# Patient Record
Sex: Female | Born: 1985 | Race: Black or African American | Hispanic: No | Marital: Single | Smoking: Never smoker
Health system: Southern US, Community
[De-identification: ages and names within clinical notes are randomized; demographics above are authoritative.]

## PROBLEM LIST (undated history)

## (undated) DIAGNOSIS — E282 Polycystic ovarian syndrome: Secondary | ICD-10-CM

## (undated) DIAGNOSIS — K589 Irritable bowel syndrome without diarrhea: Secondary | ICD-10-CM

## (undated) HISTORY — PX: APPENDECTOMY: SHX54

## (undated) HISTORY — DX: Polycystic ovarian syndrome: E28.2

## (undated) HISTORY — DX: Irritable bowel syndrome, unspecified: K58.9

## (undated) HISTORY — PX: FLEXIBLE SIGMOIDOSCOPY: SHX1649

---
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
## 2007-09-09 ENCOUNTER — Emergency Department (HOSPITAL_COMMUNITY): Admission: EM | Admit: 2007-09-09 | Discharge: 2007-09-09 | Payer: Self-pay | Admitting: Emergency Medicine

## 2007-09-14 ENCOUNTER — Emergency Department (HOSPITAL_COMMUNITY): Admission: EM | Admit: 2007-09-14 | Discharge: 2007-09-14 | Payer: Self-pay | Admitting: Emergency Medicine

## 2008-11-12 ENCOUNTER — Emergency Department (HOSPITAL_COMMUNITY): Admission: EM | Admit: 2008-11-12 | Discharge: 2008-11-12 | Payer: Self-pay | Admitting: Emergency Medicine

## 2010-05-24 LAB — CBC
HCT: 29.6 % — ABNORMAL LOW (ref 36.0–46.0)
Hemoglobin: 9.5 g/dL — ABNORMAL LOW (ref 12.0–15.0)
MCHC: 32.2 g/dL (ref 30.0–36.0)
MCV: 78.2 fL (ref 78.0–100.0)
Platelets: 258 10*3/uL (ref 150–400)
RBC: 3.78 MIL/uL — ABNORMAL LOW (ref 3.87–5.11)
RDW: 14.4 % (ref 11.5–15.5)
WBC: 8.4 10*3/uL (ref 4.0–10.5)

## 2010-05-24 LAB — DIFFERENTIAL
Basophils Absolute: 0 10*3/uL (ref 0.0–0.1)
Basophils Relative: 0 % (ref 0–1)
Eosinophils Absolute: 0.1 10*3/uL (ref 0.0–0.7)
Eosinophils Relative: 1 % (ref 0–5)
Lymphocytes Relative: 28 % (ref 12–46)
Lymphs Abs: 2.4 10*3/uL (ref 0.7–4.0)
Monocytes Absolute: 0.8 10*3/uL (ref 0.1–1.0)
Monocytes Relative: 9 % (ref 3–12)
Neutro Abs: 5.1 10*3/uL (ref 1.7–7.7)
Neutrophils Relative %: 62 % (ref 43–77)

## 2010-05-24 LAB — COMPREHENSIVE METABOLIC PANEL
ALT: 12 U/L (ref 0–35)
AST: 19 U/L (ref 0–37)
Albumin: 3.9 g/dL (ref 3.5–5.2)
Alkaline Phosphatase: 81 U/L (ref 39–117)
BUN: 12 mg/dL (ref 6–23)
CO2: 27 mEq/L (ref 19–32)
Calcium: 9.5 mg/dL (ref 8.4–10.5)
Chloride: 103 mEq/L (ref 96–112)
Creatinine, Ser: 0.88 mg/dL (ref 0.4–1.2)
GFR calc Af Amer: >60 mL/min (ref >60)
GFR calc non Af Amer: >60 mL/min (ref >60)
Glucose, Bld: 92 mg/dL (ref 70–99)
Potassium: 4.6 mEq/L (ref 3.5–5.1)
Sodium: 136 mEq/L (ref 135–145)
Total Bilirubin: 0.4 mg/dL (ref 0.3–1.2)
Total Protein: 7.4 g/dL (ref 6.0–8.3)

## 2010-05-24 LAB — URINE MICROSCOPIC-ADD ON

## 2010-05-24 LAB — POCT CARDIAC MARKERS
Myoglobin, poc: 38.9 ng/mL (ref 12–200)
Troponin i, poc: <0.05 ng/mL (ref 0.00–0.09)

## 2010-05-24 LAB — WET PREP, GENITAL
Trich, Wet Prep: NONE SEEN
Yeast Wet Prep HPF POC: NONE SEEN

## 2010-05-24 LAB — URINALYSIS, ROUTINE W REFLEX MICROSCOPIC
Bilirubin Urine: NEGATIVE
Nitrite: NEGATIVE
Protein, ur: NEGATIVE mg/dL
Specific Gravity, Urine: 1.025 (ref 1.005–1.030)
Urobilinogen, UA: 0.2 mg/dL (ref 0.0–1.0)

## 2010-05-24 LAB — PREGNANCY, URINE: Preg Test, Ur: NEGATIVE

## 2010-05-24 LAB — GC/CHLAMYDIA PROBE AMP, GENITAL: GC Probe Amp, Genital: NEGATIVE

## 2010-10-16 IMAGING — CR DG CHEST 2V
2 series · 2 of 2 positions shown · non-contrast
Comparison: None

CLINICAL DATA: Chest pain and back pain

CHEST - 2 VIEW

[view not recorded (1 of 2)]
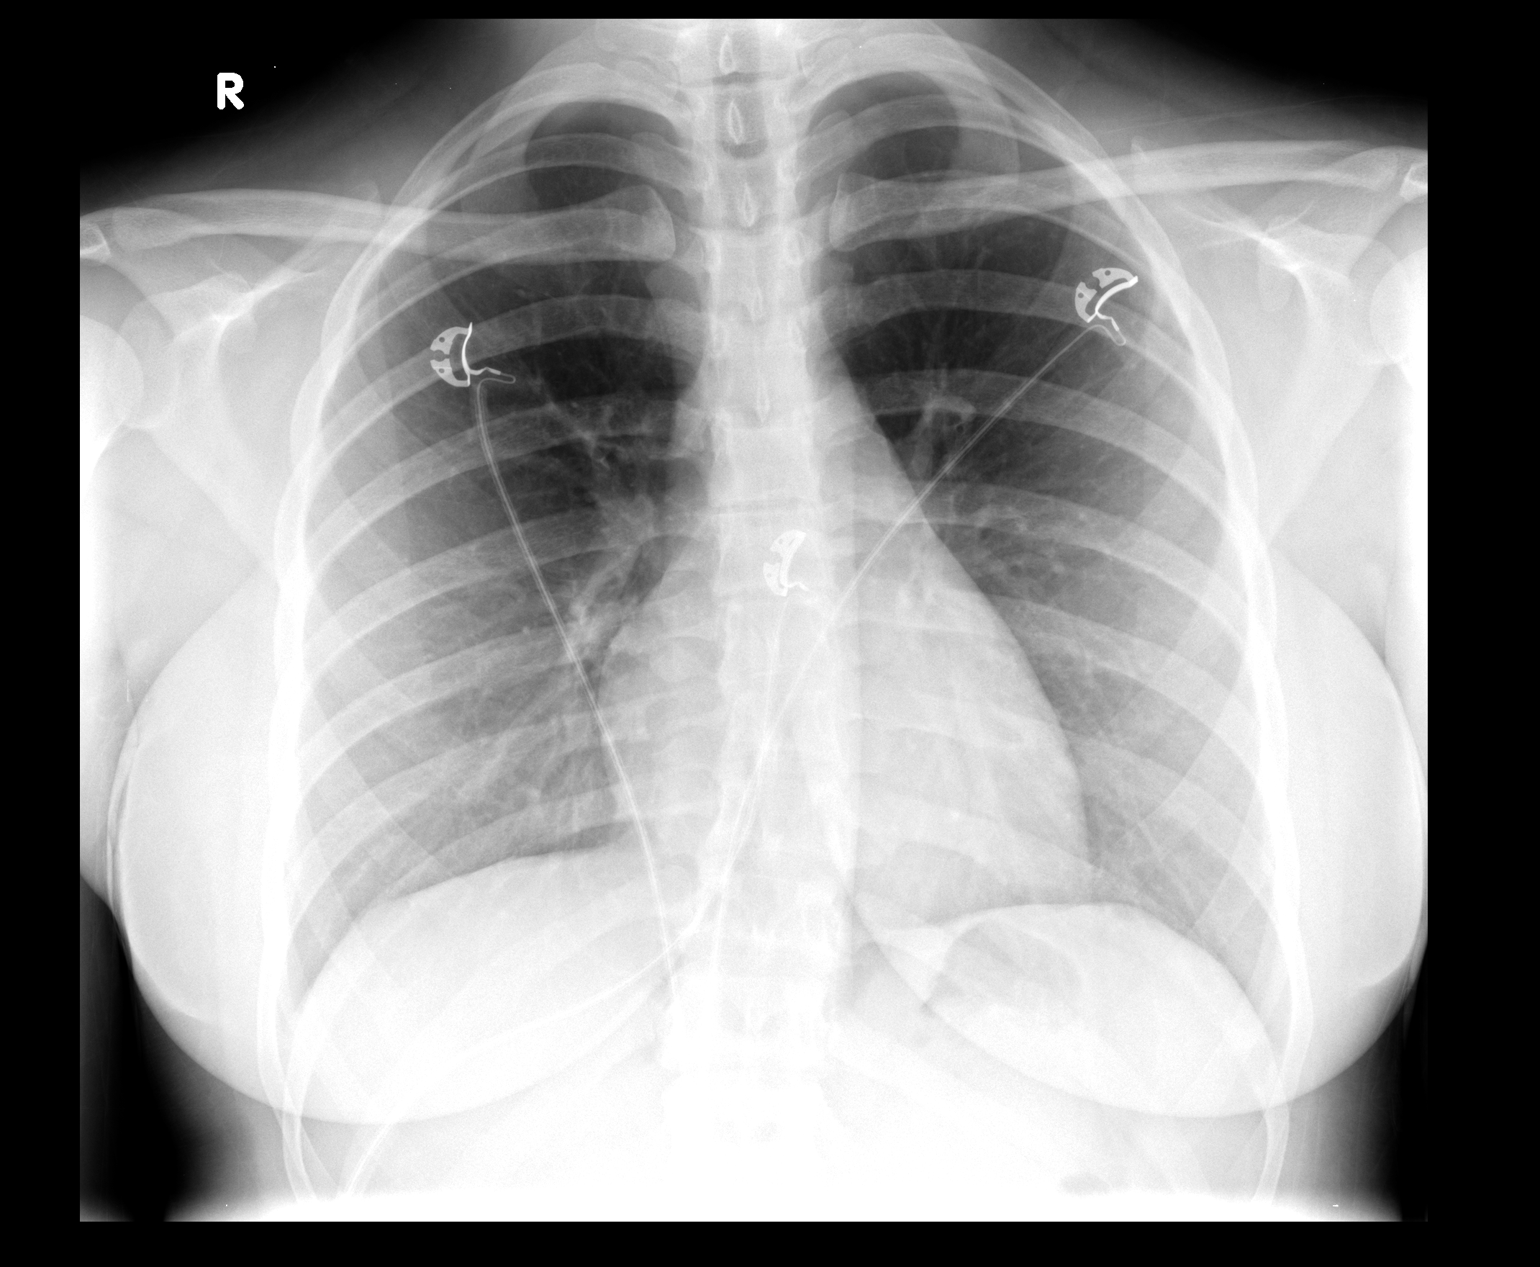

[view not recorded (2 of 2)]
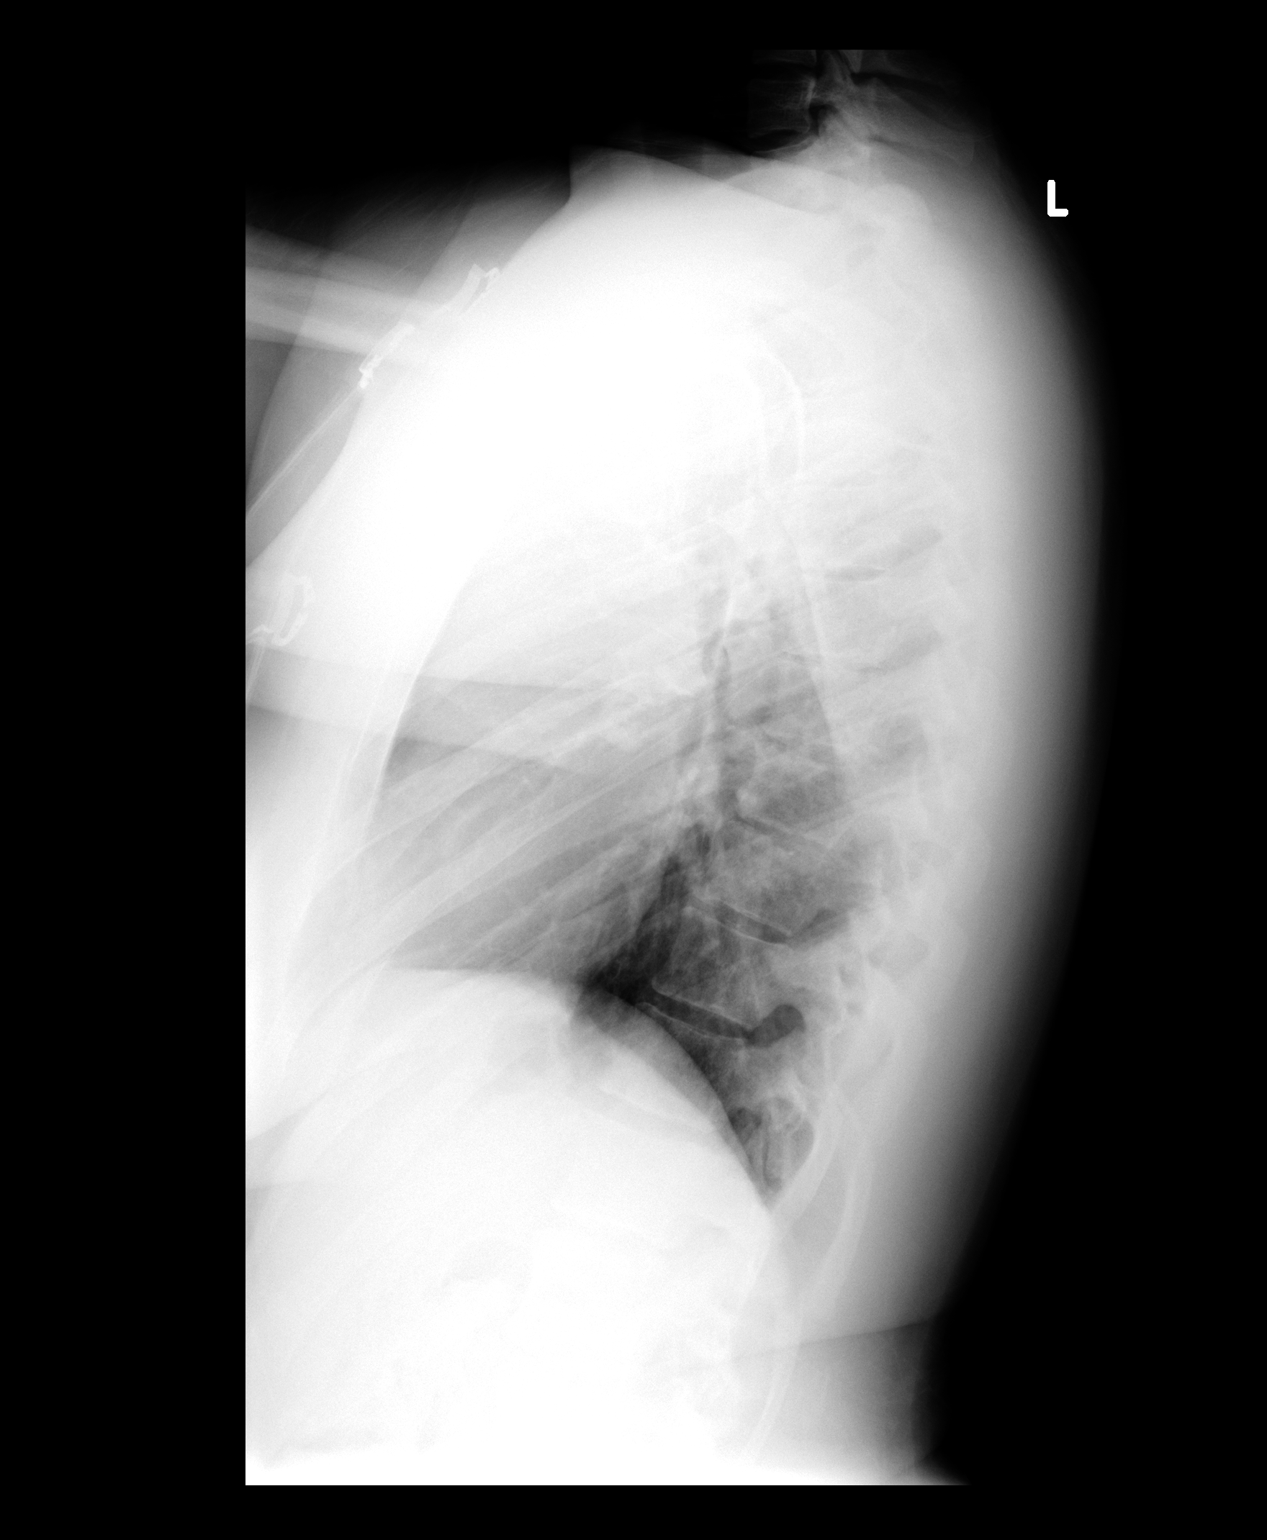

[2 of 2 positions shown; findings below may reference images not displayed]

FINDINGS: Normal mediastinum and heart silhouette.  Costophrenic
angles are clear.  No evidence effusion, infiltrate, or
pneumothorax.
IMPRESSION: No acute cardiopulmonary process.

## 2014-12-25 ENCOUNTER — Encounter (HOSPITAL_COMMUNITY): Payer: Self-pay | Admitting: *Deleted

## 2014-12-25 ENCOUNTER — Emergency Department (HOSPITAL_COMMUNITY)
Admission: EM | Admit: 2014-12-25 | Discharge: 2014-12-25 | Disposition: A | Discharge status: Home / Self Care | Payer: 59 | Attending: Emergency Medicine | Admitting: Emergency Medicine

## 2014-12-25 DIAGNOSIS — Z880 Allergy status to penicillin: Secondary | ICD-10-CM | POA: Insufficient documentation

## 2014-12-25 DIAGNOSIS — L259 Unspecified contact dermatitis, unspecified cause: Secondary | ICD-10-CM | POA: Diagnosis not present

## 2014-12-25 DIAGNOSIS — R21 Rash and other nonspecific skin eruption: Secondary | ICD-10-CM | POA: Diagnosis present

## 2014-12-25 MED ORDER — PREDNISONE 20 MG PO TABS: ORAL_TABLET | ORAL | Status: DC

## 2014-12-25 MED ORDER — DEXAMETHASONE SODIUM PHOSPHATE 10 MG/ML IJ SOLN
10.0000 mg | Freq: Once | INTRAMUSCULAR | Status: AC
  Administered 2014-12-25: 10 mg via INTRAMUSCULAR
  Filled 2014-12-25: qty 1

## 2014-12-25 MED ORDER — HYDROXYZINE HCL 25 MG PO TABS: 25.0000 mg | ORAL_TABLET | Freq: Four times a day (QID) | ORAL | Status: DC

## 2014-12-25 NOTE — ED Notes (Signed)
Pt c/o rash all over body x 2 weeks, pt states it has gotten progressively worse

## 2014-12-25 NOTE — ED Provider Notes (Signed)
CSN: [id number]     Arrival date & time 12/25/14  0426 History   First MD Initiated Contact with Patient 12/25/14 0448     Chief Complaint  Patient presents with  . Rash     (Consider location/radiation/quality/duration/timing/severity/associated sxs/prior Treatment) HPI Comments: Patient presents to the ER for evaluation of rash. Patient reports that she broke out in a rash 2 weeks ago. She was seen a week ago at urgent care and was given a steroid shot and prescribed hydroxyzine, but rash has not improved. She reports a diffuse itchy raised rash. She has not identified any allergens or new topical skin products. 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 There is no tongue swelling or throat swelling. She is not short of breath.  Patient is a 29 y.o. female presenting with rash.  Rash   History reviewed. No pertinent past medical history. Past Surgical History  Procedure Laterality Date  . Appendectomy    . Flexible sigmoidoscopy     History reviewed. No pertinent family history. Social History  Substance Use Topics  . Smoking status: Never Smoker   . Smokeless tobacco: None  . Alcohol Use: Yes     Comment: occasionally   OB History    No data available     Review of Systems  Skin: Positive for rash.  All other systems reviewed and are negative.     Allergies  Amoxicillin; Benadryl; Nsaids; and Penicillins  Home Medications   Prior to Admission medications   Not on File   LMP 12/20/2014 Physical Exam  Constitutional: She is oriented to person, place, and time. She appears well-developed and well-nourished. No distress.  HENT:  Head: Normocephalic and atraumatic.  Right Ear: Hearing normal.  Left Ear: Hearing normal.  Nose: Nose normal.  Mouth/Throat: Oropharynx is clear and moist and mucous membranes are normal.  Eyes: Conjunctivae and EOM are normal.  Pupils are equal, round, and reactive to light.  Neck: Normal range of motion. Neck supple.  Cardiovascular: Regular rhythm, S1 normal and S2 normal.  Exam reveals no gallop and no friction rub.   No murmur heard. Pulmonary/Chest: Effort normal and breath sounds normal. No respiratory distress. She exhibits no tenderness.  Abdominal: Soft. Normal appearance and bowel sounds are normal. There is no hepatosplenomegaly. There is no tenderness. There is no rebound, no guarding, no tenderness at McBurney's point and negative Murphy's sign. No hernia.  Musculoskeletal: Normal range of motion.  Neurological: She is alert and oriented to person, place, and time. She has normal strength. No cranial nerve deficit or sensory deficit. Coordination normal. GCS eye subscore is 4. GCS verbal subscore is 5. GCS motor subscore is 6.  Skin: Skin is warm, dry and intact. Rash (diffuse papular and macular raised rash without induration, erythema or warmth) noted. No cyanosis.  Psychiatric: She has a normal mood and affect. Her speech is normal and behavior is normal. Thought content normal.  Nursing note and vitals reviewed.   ED Course  Procedures (including critical care time) Labs Review Labs Reviewed - No data to display  Imaging Review No results found. I have personally reviewed and evaluated these images and lab results as part of my medical decision-making.   EKG Interpretation None      MDM   Final diagnoses:  None   contact dermatitis  Patient has a rash that  is diffusely should be over her body's, although worse on her upper extremities. The rash is not only in exposed areas. She does not have any systemic signs. Symptoms seem most consistent with an allergic reaction. Give dose of Decadron here and to a formal prednisone taper, continue hydroxyzine as needed for symptomatic relief. Follow-up with primary doctor to be referred to dermatologist if not improving.    Gilda Crease,  MD 12/25/14 [phone]

## 2014-12-25 NOTE — Discharge Instructions (Signed)
Contact Dermatitis Dermatitis is redness, soreness, and swelling (inflammation) of the skin. Contact dermatitis is a reaction to certain substances that touch the skin. There are two types of contact dermatitis:   Irritant contact dermatitis. This type is caused by something that irritates your skin, such as dry hands from washing them too much. This type does not require previous exposure to the substance for a reaction to occur. This type is more common.  Allergic contact dermatitis. This type is caused by a substance that you are allergic to, such as a nickel allergy or poison ivy. This type only occurs if you have been exposed to the substance (allergen) before. Upon a repeat exposure, your body reacts to the substance. This type is less common. CAUSES  Many different substances can cause contact dermatitis. Irritant contact dermatitis is most commonly caused by exposure to:   Makeup.   Soaps.   Detergents.   Bleaches.   Acids.   Metal salts, such as nickel.  Allergic contact dermatitis is most commonly caused by exposure to:   Poisonous plants.   Chemicals.   Jewelry.   Latex.   Medicines.   Preservatives in products, such as clothing.  RISK FACTORS This condition is more likely to develop in:   People who have jobs that expose them to irritants or allergens.  People who have certain medical conditions, such as asthma or eczema.  SYMPTOMS  Symptoms of this condition may occur anywhere on your body where the irritant has touched you or is touched by you. Symptoms include:  Dryness or flaking.   Redness.   Cracks.   Itching.   Pain or a burning feeling.   Blisters.  Drainage of small amounts of blood or clear fluid from skin cracks. With allergic contact dermatitis, there may also be swelling in areas such as the eyelids, mouth, or genitals.  DIAGNOSIS  This condition is diagnosed with a medical history and physical exam. A patch skin test  may be performed to help determine the cause. If the condition is related to your job, you may need to see an occupational medicine specialist. TREATMENT Treatment for this condition includes figuring out what caused the reaction and protecting your skin from further contact. Treatment may also include:   Steroid creams or ointments. Oral steroid medicines may be needed in more severe cases.  Antibiotics or antibacterial ointments, if a skin infection is present.  Antihistamine lotion or an antihistamine taken by mouth to ease itching.  A bandage (dressing). HOME CARE INSTRUCTIONS Skin Care  Moisturize your skin as needed.   Apply cool compresses to the affected areas.  Try taking a bath with:  Epsom salts. Follow the instructions on the packaging. You can get these at your local pharmacy or grocery store.  Baking soda. Pour a small amount into the bath as directed by your health care provider.  Colloidal oatmeal. Follow the instructions on the packaging. You can get this at your local pharmacy or grocery store.  Try applying baking soda paste to your skin. Stir water into baking soda until it reaches a paste-like consistency.  Do not scratch your skin.  Bathe less frequently, such as every other day.  Bathe in lukewarm water. Avoid using hot water. Medicines  Take or apply over-the-counter and prescription medicines only as told by your health care provider.   If you were prescribed an antibiotic medicine, take or apply your antibiotic as told by your health care provider. Do not stop using the   antibiotic even if your condition starts to improve. General Instructions  Keep all follow-up visits as told by your health care provider. This is important.  Avoid the substance that caused your reaction. If you do not know what caused it, keep a journal to try to track what caused it. Write down:  What you eat.  What cosmetic products you use.  What you drink.  What  you wear in the affected area. This includes jewelry.  If you were given a dressing, take care of it as told by your health care provider. This includes when to change and remove it. SEEK MEDICAL CARE IF:   Your condition does not improve with treatment.  Your condition gets worse.  You have signs of infection such as swelling, tenderness, redness, soreness, or warmth in the affected area.  You have a fever.  You have new symptoms. SEEK IMMEDIATE MEDICAL CARE IF:   You have a severe headache, neck pain, or neck stiffness.  You vomit.  You feel very sleepy.  You notice red streaks coming from the affected area.  Your bone or joint underneath the affected area becomes painful after the skin has healed.  The affected area turns darker.  You have difficulty breathing.   This information is not intended to replace advice given to you by your health care provider. Make sure you discuss any questions you have with your health care provider.   Document Released: 02/01/2000 Document Revised: 10/25/2014 Document Reviewed: 06/21/2014 Elsevier Interactive Patient Education 2016 Elsevier Inc.  

## 2015-09-03 ENCOUNTER — Emergency Department (HOSPITAL_COMMUNITY)
Admission: EM | Admit: 2015-09-03 | Discharge: 2015-09-03 | Disposition: A | Discharge status: Home / Self Care | Payer: BLUE CROSS/BLUE SHIELD | Attending: Emergency Medicine | Admitting: Emergency Medicine

## 2015-09-03 ENCOUNTER — Encounter (HOSPITAL_COMMUNITY): Payer: Self-pay | Admitting: Emergency Medicine

## 2015-09-03 DIAGNOSIS — H9209 Otalgia, unspecified ear: Secondary | ICD-10-CM | POA: Diagnosis not present

## 2015-09-03 DIAGNOSIS — J029 Acute pharyngitis, unspecified: Secondary | ICD-10-CM

## 2015-09-03 DIAGNOSIS — Z79899 Other long term (current) drug therapy: Secondary | ICD-10-CM | POA: Insufficient documentation

## 2015-09-03 LAB — RAPID STREP SCREEN (MED CTR MEBANE ONLY): STREPTOCOCCUS, GROUP A SCREEN (DIRECT): NEGATIVE

## 2015-09-03 NOTE — ED Notes (Signed)
Patient complaining of sore throat and laryngitis starting this morning.

## 2015-09-03 NOTE — ED Provider Notes (Signed)
CSN: [id number]     Arrival date & time 09/03/15  1346 History  By signing my name below, I, Denise Marsh, attest that this documentation has been prepared under the direction and in the presence of Kerrie Buffalo, NP. Electronically Signed: Alyssa Marsh, ED Scribe. 09/03/2015. 2:54 PM.    Chief Complaint  Patient presents with  . Sore Throat    The history is provided by the patient. No language interpreter was used.    HPI Comments: Denise Marsh is a 30 y.o. female who presents to the Emergency Department complaining of gradual onset sore throat onset this morning. Pt states she woke up this morning with a sore throat. Pt states she has slight ear pain when swallowing. She reports associated loss of her voice. Pt states that no one at home is sick. She states she was able to eat this morning, but that it hurts to eat and drink. Pt denies vomiting, fever.   History reviewed. No pertinent past medical history. Past Surgical History  Procedure Laterality Date  . Appendectomy    . Flexible sigmoidoscopy     History reviewed. No pertinent family history. Social History  Substance Use Topics  . Smoking status: Never Smoker   . Smokeless tobacco: None  . Alcohol Use: Yes     Comment: occasionally   OB History    No data available     Review of Systems  Constitutional: Negative for fever and chills.  HENT: Positive for ear pain and sore throat.   Eyes: Negative for redness.  Respiratory: Negative for cough and shortness of breath.   Gastrointestinal: Negative for vomiting.  Genitourinary: Negative for dysuria.  Skin: Negative for rash.  Neurological: Negative for syncope and headaches.  Psychiatric/Behavioral: Negative for confusion. The patient is not nervous/anxious.     Allergies  Amoxicillin; Benadryl; Nsaids; and Penicillins  Home Medications   Prior to Admission medications   Medication Sig Start Date End Date Taking? Authorizing Provider  Norethindrone-Eth  Estradiol (PIRMELLA 1/35 PO) Take 1 tablet by mouth daily.   Yes Historical Provider, MD  hydrOXYzine (ATARAX/VISTARIL) 25 MG tablet Take 1 tablet (25 mg total) by mouth every 6 (six) hours. Patient not taking: Reported on 09/03/2015 12/25/14   Denise Crease, MD  predniSONE (DELTASONE) 20 MG tablet 3 tabs po daily x 3 days, then 2 tabs x 3 days, then 1.5 tabs x 3 days, then 1 tab x 3 days, then 0.5 tabs x 3 days Patient not taking: Reported on 09/03/2015 12/25/14   Denise Crease, MD   BP 141/81 mmHg  Pulse 91  Temp(Src) 98.5 F (36.9 C) (Oral)  Resp 16  Ht  (1.651 m)  Wt 102.059 kg  BMI 37.44 kg/m2  SpO2 96%  LMP 09/02/2015 Physical Exam  Constitutional: She is oriented to person, place, and time. She appears well-developed and well-nourished. No distress.  HENT:  Head: Normocephalic.  Right Ear: External ear normal.  Left Ear: External ear normal.  Tonsils are slightly enlarged, mild erythema, no edema Uvula midline  Eyes: Conjunctivae are normal. Pupils are equal, round, and reactive to light. No scleral icterus.  Neck: Normal range of motion. Neck supple. No thyromegaly present.  No CVA tenderness  Cardiovascular: Normal rate and regular rhythm.   Pulmonary/Chest: Effort normal and breath sounds normal. No respiratory distress.  Abdominal: Soft. She exhibits no distension.  Musculoskeletal: Normal range of motion.  Lymphadenopathy:    She has no cervical adenopathy.  Neurological: She  is alert and oriented to person, place, and time.  Skin: Skin is warm and dry. No rash noted.  Psychiatric: She has a normal mood and affect. Her behavior is normal.  Nursing note and vitals reviewed.   ED Course  Procedures (including critical care time)  DIAGNOSTIC STUDIES: Oxygen Saturation is 96% on RA, adequate by my interpretation.    COORDINATION OF CARE: 2:46 PM Discussed treatment plan with pt at bedside which includes Rapid Strep Test and pt agreed to  plan.  Labs Review Labs Reviewed  RAPID STREP SCREEN (NOT AT [facility])  CULTURE, GROUP A STREP [facility])   I have personally reviewed and evaluated these lab results as part of my medical decision-making.   MDM  30 y.o. female with sore throat and laryngitis stable for d/c without difficulty swallowing, she has no fever and does not appear toxic. Will treat for viral sore throat with Chloraseptic Spray, tylenol and ibuprofen and salt water gargles. She will f/u with her PCP or return here for any problems. Discussed with the patient and all questioned fully answered.   Final diagnoses:  Viral pharyngitis   I personally performed the services described in this documentation, which was scribed in my presence. The recorded information has been reviewed and is accurate.   [place] M [place], [place] 09/03/15 1502  Blane OharaJoshua Zavitz, MD 09/04/15 [phone]

## 2015-09-03 NOTE — Discharge Instructions (Signed)
Your strep screen today is negative. Take tyleol and ibuprofen as needed for fever and/or pain. Use salt water gargles, Chlorasepetic spray and follow up with your doctor. Return here as needed for any problems.   Sore Throat A sore throat is pain, burning, irritation, or scratchiness of the throat. There is often pain or tenderness when swallowing or talking. A sore throat may be accompanied by other symptoms, such as coughing, sneezing, fever, and swollen neck glands. A sore throat is often the first sign of another sickness, such as a cold, flu, strep throat, or mononucleosis (commonly known as mono). Most sore throats go away without medical treatment. CAUSES  The most common causes of a sore throat include:  A viral infection, such as a cold, flu, or mono.  A bacterial infection, such as strep throat, tonsillitis, or whooping cough.  Seasonal allergies.  Dryness in the air.  Irritants, such as smoke or pollution.  Gastroesophageal reflux disease (GERD). HOME CARE INSTRUCTIONS   Only take over-the-counter medicines as directed by your caregiver.  Drink enough fluids to keep your urine clear or pale yellow.  Rest as needed.  Try using throat sprays, lozenges, or sucking on hard candy to ease any pain (if older than 4 years or as directed).  Sip warm liquids, such as broth, herbal tea, or warm water with honey to relieve pain temporarily. You may also eat or drink cold or frozen liquids such as frozen ice pops.  Gargle with salt water (mix 1 tsp salt with 8 oz of water).  Do not smoke and avoid secondhand smoke.  Put a cool-mist humidifier in your bedroom at night to moisten the air. You can also turn on a hot shower and sit in the bathroom with the door closed for 5-10 minutes. SEEK IMMEDIATE MEDICAL CARE IF:  You have difficulty breathing.  You are unable to swallow fluids, soft foods, or your saliva.  You have increased swelling in the throat.  Your sore throat does  not get better in 7 days.  You have nausea and vomiting.  You have a fever or persistent symptoms for more than 2-3 days.  You have a fever and your symptoms suddenly get worse. MAKE SURE YOU:   Understand these instructions.  Will watch your condition.  Will get help right away if you are not doing well or get worse.   This information is not intended to replace advice given to you by your health care provider. Make sure you discuss any questions you have with your health care provider.   Document Released: 03/13/2004 Document Revised: 02/24/2014 Document Reviewed: 10/12/2011 Elsevier Interactive Patient Education [employer]2016 Elsevier Inc.

## 2015-09-06 LAB — CULTURE, GROUP A STREP (THRC)

## 2017-06-01 DIAGNOSIS — K760 Fatty (change of) liver, not elsewhere classified: Secondary | ICD-10-CM | POA: Insufficient documentation

## 2017-06-17 ENCOUNTER — Encounter: Payer: Self-pay | Admitting: Advanced Practice Midwife

## 2017-07-01 ENCOUNTER — Encounter: Payer: Self-pay | Admitting: Advanced Practice Midwife

## 2019-04-22 ENCOUNTER — Ambulatory Visit: Payer: BLUE CROSS/BLUE SHIELD | Attending: Internal Medicine

## 2019-04-22 DIAGNOSIS — Z23 Encounter for immunization: Secondary | ICD-10-CM | POA: Insufficient documentation

## 2019-04-22 NOTE — Progress Notes (Signed)
   Covid-19 Vaccination Clinic  Name:  Denise Marsh    MRN: [id number] DOB: 1985-07-28  04/22/2019  [name] was observed post Covid-19 immunization for 15 minutes without incident. She was provided with Vaccine Information Sheet and instruction to access the V-Safe system.   [name] was instructed to call 911 with any severe reactions post vaccine: Marland Kitchen Difficulty breathing  . Swelling of face and throat  . A fast heartbeat  . A bad rash all over body  . Dizziness and weakness   Immunizations Administered    Name Date Dose VIS Date Route   Moderna COVID-19 Vaccine 04/22/2019 10:40 AM 0.5 mL 01/18/2019 Intramuscular   Manufacturer: Moderna   Lot: [id number]   NDC: 58346-219-47

## 2019-05-25 ENCOUNTER — Ambulatory Visit: Payer: Self-pay

## 2019-05-25 ENCOUNTER — Ambulatory Visit: Payer: Self-pay | Attending: Internal Medicine

## 2019-05-25 DIAGNOSIS — Z23 Encounter for immunization: Secondary | ICD-10-CM

## 2019-05-25 NOTE — Progress Notes (Signed)
   Covid-19 Vaccination Clinic  Name:  ATIANNA HAIDAR    MRN: [id number] DOB: 10-Sep-1985  05/25/2019  Ms. Dobos was observed post Covid-19 immunization for 15 minutes without incident. She was provided with Vaccine Information Sheet and instruction to access the V-Safe system.   Ms. Egli was instructed to call 911 with any severe reactions post vaccine: Marland Kitchen Difficulty breathing  . Swelling of face and throat  . A fast heartbeat  . A bad rash all over body  . Dizziness and weakness   Immunizations Administered    Name Date Dose VIS Date Route   Moderna COVID-19 Vaccine 05/25/2019 12:29 PM 0.5 mL 01/18/2019 Intramuscular   Manufacturer: Gala Murdoch   Lot: [id number]   NDC: 18288-337-44

## 2022-03-22 DIAGNOSIS — R21 Rash and other nonspecific skin eruption: Secondary | ICD-10-CM | POA: Diagnosis not present

## 2022-09-11 DIAGNOSIS — R202 Paresthesia of skin: Secondary | ICD-10-CM | POA: Diagnosis not present

## 2022-09-11 DIAGNOSIS — R200 Anesthesia of skin: Secondary | ICD-10-CM | POA: Diagnosis not present

## 2023-04-28 ENCOUNTER — Ambulatory Visit (INDEPENDENT_AMBULATORY_CARE_PROVIDER_SITE_OTHER): Payer: Self-pay | Admitting: Nurse Practitioner

## 2023-04-28 ENCOUNTER — Encounter: Payer: Self-pay | Admitting: Nurse Practitioner

## 2023-04-28 VITALS — BP 116/73 | HR 100 | Temp 98.3°F | Ht 65.0 in | Wt 236.8 lb

## 2023-04-28 DIAGNOSIS — R631 Polydipsia: Secondary | ICD-10-CM | POA: Diagnosis not present

## 2023-04-28 DIAGNOSIS — E66812 Obesity, class 2: Secondary | ICD-10-CM

## 2023-04-28 DIAGNOSIS — Z8742 Personal history of other diseases of the female genital tract: Secondary | ICD-10-CM

## 2023-04-28 DIAGNOSIS — F419 Anxiety disorder, unspecified: Secondary | ICD-10-CM | POA: Diagnosis not present

## 2023-04-28 DIAGNOSIS — F32A Depression, unspecified: Secondary | ICD-10-CM | POA: Insufficient documentation

## 2023-04-28 DIAGNOSIS — E6609 Other obesity due to excess calories: Secondary | ICD-10-CM

## 2023-04-28 DIAGNOSIS — N921 Excessive and frequent menstruation with irregular cycle: Secondary | ICD-10-CM

## 2023-04-28 DIAGNOSIS — Z0001 Encounter for general adult medical examination with abnormal findings: Secondary | ICD-10-CM

## 2023-04-28 DIAGNOSIS — Z6839 Body mass index (BMI) 39.0-39.9, adult: Secondary | ICD-10-CM

## 2023-04-28 MED ORDER — SERTRALINE HCL 25 MG PO TABS: 25.0000 mg | ORAL_TABLET | Freq: Every day | ORAL | 1 refills | Status: DC

## 2023-04-28 NOTE — Progress Notes (Unsigned)
 New Patient Office Visit  Subjective   Patient ID: Denise Marsh, female    DOB: 08/01/1985  Age: 38 y.o. MRN: [id number]  CC:  Chief Complaint  Patient presents with   Establish Care    HPI Denise Marsh is a 38 yrs old female presents 04/28/2023  to establish care c/o of polydipsia  Polydipsia presenting with a 5-month history of polydipsia, characterized by increased thirst and a need to drink fluids more frequently than usual. The patient reports drinking significantly more water throughout the day, sometimes feeling excessively thirsty even after drinking. Despite increased fluid intake, they still feel thirsty and have noted frequent urination.  The patient denies any changes in diet or physical activity level, and there are no associated symptoms such as fever, weight loss, or changes in appetite. The patient does not have a history of diabetes but is concerned that these symptoms may be indicative of the condition, especially since they have a family history of diabetes. There is no history of excessive fatigue, blurry vision, or other signs of hyperglycemia. The patient denies any recent illness, trauma, or medication changes that could explain the increased thirst. They also have not experienced any chest pain, dizziness, or syncope. Will order A1c to r/o diabetes      04/28/2023    4:05 PM  GAD 7 : Generalized Anxiety Score  Nervous, Anxious, on Edge 2  Control/stop worrying 2  Worry too much - different things 2  Trouble relaxing 0  Restless 0  Easily annoyed or irritable 2  Afraid - awful might happen 1  Total GAD 7 Score 9  Anxiety Difficulty Not difficult at all     [facility] from 04/28/2023 in [facility]  PHQ-9 Total Score 4       Outpatient Encounter Medications as of 04/28/2023  Medication Sig   sertraline (ZOLOFT) 25 MG tablet Take 1 tablet (25 mg total) by mouth daily.   Norethindrone-Eth Estradiol  (PIRMELLA 1/35 PO) Take 1 tablet by mouth daily. (Patient not taking: Reported on 04/28/2023)   No facility-administered encounter medications on file as of 04/28/2023.    Past Medical History:  Diagnosis Date   IBS (irritable bowel syndrome)    PCOS (polycystic ovarian syndrome)     Past Surgical History:  Procedure Laterality Date   APPENDECTOMY     FLEXIBLE SIGMOIDOSCOPY      Family History  Problem Relation Age of Onset   Diabetes Mother    Hypertension Mother    Diabetes Father    Hypertension Father    Leukemia Maternal Grandmother    Diabetes Maternal Grandfather    Hypertension Maternal Grandfather    Diabetes Paternal Grandmother    Hypertension Paternal Grandmother    Diabetes Paternal Grandfather    Hypertension Paternal Grandfather     Social History   Socioeconomic History   Marital status: Single    Spouse name: Not on file   Number of children: Not on file   Years of education: Not on file   Highest education level: Not on file  Occupational History   Not on file  Tobacco Use   Smoking status: Never   Smokeless tobacco: Never  Vaping Use   Vaping status: Never Used  Substance and Sexual Activity   Alcohol use: Yes    Comment: occasionally   Drug use: No   Sexual activity: Yes    Birth control/protection: Condom  Other Topics Concern  Not on file  Social History Narrative   Not on file   Social Drivers of Health   Financial Resource Strain: Low Risk  (04/28/2023)   Overall Financial Resource Strain (CARDIA)    Difficulty of Paying Living Expenses: Not hard at all  Food Insecurity: No Food Insecurity (04/28/2023)   Hunger Vital Sign    Worried About Running Out of Food in the Last Year: Never true    Ran Out of Food in the Last Year: Never true  Transportation Needs: No Transportation Needs (04/28/2023)   PRAPARE - Administrator, Civil Service (Medical): No    Lack of Transportation (Non-Medical): No  Physical Activity:  Sufficiently Active (04/28/2023)   Exercise Vital Sign    Days of Exercise per Week: 3 days    Minutes of Exercise per Session: 150+ min  Stress: Not on file  Social Connections: Not on file  Intimate Partner Violence: Not At Risk (04/28/2023)   Humiliation, Afraid, Rape, and Kick questionnaire    Fear of Current or Ex-Partner: No    Emotionally Abused: No    Physically Abused: No    Sexually Abused: No    Review of Systems  Constitutional:  Negative for chills and fever.  HENT:  Negative for sinus pain and sore throat.   Respiratory:  Negative for cough and shortness of breath.   Skin:  Negative for itching and rash.   Negative unless indicated in HPI    Objective   BP 116/73   Pulse 100   Temp 98.3 F (36.8 C) (Temporal)   Ht 5\' 5"  (1.651 m)   Wt 236 lb 12.8 oz (107.4 kg)   LMP 04/03/2023 (Approximate) Comment: pcos  SpO2 94%   BMI 39.41 kg/m   Physical Exam Vitals and nursing note reviewed.  Constitutional:      General: She is not in acute distress. HENT:     Head: Normocephalic and atraumatic.     Right Ear: Tympanic membrane, ear canal and external ear normal. There is no impacted cerumen.     Left Ear: Tympanic membrane, ear canal and external ear normal. There is no impacted cerumen.     Nose: Nose normal.  Eyes:     General: No scleral icterus.    Extraocular Movements: Extraocular movements intact.     Conjunctiva/sclera: Conjunctivae normal.     Pupils: Pupils are equal, round, and reactive to light.  Neck:     Vascular: No carotid bruit.  Cardiovascular:     Heart sounds: Normal heart sounds.  Pulmonary:     Effort: Pulmonary effort is normal.     Breath sounds: Normal breath sounds.  Musculoskeletal:        General: Normal range of motion.     Cervical back: Normal range of motion and neck supple. No rigidity or tenderness.     Right lower leg: No edema.     Left lower leg: No edema.  Lymphadenopathy:     Cervical: No cervical adenopathy.   Skin:    General: Skin is warm and dry.     Findings: No rash.  Neurological:     Mental Status: She is alert and oriented to person, place, and time. Mental status is at baseline.  Psychiatric:        Mood and Affect: Mood normal.        Behavior: Behavior normal.        Thought Content: Thought content normal.  Judgment: Judgment normal.     Last CBC Lab Results  Component Value Date   WBC 8.4 11/12/2008   HGB 9.5 (L) 11/12/2008   HCT 29.6 (L) 11/12/2008   MCV 78.2 11/12/2008   RDW 14.4 11/12/2008   PLT 258 11/12/2008   Last metabolic panel Lab Results  Component Value Date   GLUCOSE 92 11/12/2008   NA 136 11/12/2008   K 4.6 11/12/2008   CL 103 11/12/2008   CO2 27 11/12/2008   BUN 12 11/12/2008   CREATININE 0.88 11/12/2008   CALCIUM 9.5 11/12/2008   PROT 7.4 11/12/2008   ALBUMIN 3.9 11/12/2008   BILITOT 0.4 11/12/2008   ALKPHOS 81 11/12/2008   AST 19 11/12/2008   ALT 12 11/12/2008       Assessment & Plan:  Anxiety and depression -     Sertraline HCl; Take 1 tablet (25 mg total) by mouth daily.  Dispense: 30 tablet; Refill: 1  Polydipsia -     Bayer DCA Hb A1c Waived  Class 2 obesity due to excess calories without serious comorbidity with body mass index (BMI) of 39.0 to 39.9 in adult  History of PCOS -     Ambulatory referral to Obstetrics / Gynecology  Menorrhagia with irregular cycle  Encounter for general adult medical examination with abnormal findings -     CBC with Differential/Platelet -     CMP14+EGFR -     Lipid panel -     Thyroid Panel With TSH   Continue healthy lifestyle choices, including diet (rich in fruits, vegetables, and lean proteins, and low in salt and simple carbohydrates) and exercise (at least 30 minutes of moderate physical activity daily).     The above assessment and management plan was discussed with the patient. The patient verbalized understanding of and has agreed to the management plan. Patient is aware to  call the clinic if they develop any new symptoms or if symptoms persist or worsen. Patient is aware when to return to the clinic for a follow-up visit. Patient educated on when it is appropriate to go to the emergency department.  Return in about 6 weeks (around 06/09/2023) for mental med dose titration.   Arrie Aran Santa Lighter, [place] [facility] [address] [place], [place] [address] [phone]  Note: This document was prepared by Reubin Milan voice dictation technology and any errors that results from this process are unintentional.

## 2023-04-29 ENCOUNTER — Other Ambulatory Visit: Payer: Self-pay | Admitting: Nurse Practitioner

## 2023-04-29 DIAGNOSIS — E119 Type 2 diabetes mellitus without complications: Secondary | ICD-10-CM | POA: Insufficient documentation

## 2023-04-29 DIAGNOSIS — Z7985 Long-term (current) use of injectable non-insulin antidiabetic drugs: Secondary | ICD-10-CM | POA: Insufficient documentation

## 2023-04-29 LAB — CBC WITH DIFFERENTIAL/PLATELET
Basophils Absolute: 0.1 10*3/uL (ref 0.0–0.2)
Basos: 1 %
EOS (ABSOLUTE): 0.1 10*3/uL (ref 0.0–0.4)
Eos: 1 %
Hematocrit: 41.3 % (ref 34.0–46.6)
Hemoglobin: 13.3 g/dL (ref 11.1–15.9)
Immature Grans (Abs): 0 10*3/uL (ref 0.0–0.1)
Immature Granulocytes: 0 %
Lymphocytes Absolute: 3.3 10*3/uL — ABNORMAL HIGH (ref 0.7–3.1)
Lymphs: 36 %
MCH: 27.5 pg (ref 26.6–33.0)
MCHC: 32.2 g/dL (ref 31.5–35.7)
MCV: 86 fL (ref 79–97)
Monocytes Absolute: 0.7 10*3/uL (ref 0.1–0.9)
Monocytes: 7 %
Neutrophils Absolute: 5.1 10*3/uL (ref 1.4–7.0)
Neutrophils: 55 %
Platelets: 227 10*3/uL (ref 150–450)
RBC: 4.83 x10E6/uL (ref 3.77–5.28)
RDW: 11.7 % (ref 11.7–15.4)
WBC: 9.3 10*3/uL (ref 3.4–10.8)

## 2023-04-29 LAB — CMP14+EGFR
ALT: 57 IU/L — ABNORMAL HIGH (ref 0–32)
AST: 33 IU/L (ref 0–40)
Albumin: 4.7 g/dL (ref 3.9–4.9)
Alkaline Phosphatase: 126 IU/L — ABNORMAL HIGH (ref 44–121)
BUN/Creatinine Ratio: 11 (ref 9–23)
BUN: 8 mg/dL (ref 6–20)
Bilirubin Total: 0.2 mg/dL (ref 0.0–1.2)
CO2: 23 mmol/L (ref 20–29)
Calcium: 10.3 mg/dL — ABNORMAL HIGH (ref 8.7–10.2)
Chloride: 95 mmol/L — ABNORMAL LOW (ref 96–106)
Creatinine, Ser: 0.73 mg/dL (ref 0.57–1.00)
Globulin, Total: 3.1 g/dL (ref 1.5–4.5)
Glucose: 340 mg/dL — ABNORMAL HIGH (ref 70–99)
Potassium: 4.6 mmol/L (ref 3.5–5.2)
Sodium: 137 mmol/L (ref 134–144)
Total Protein: 7.8 g/dL (ref 6.0–8.5)
eGFR: 108 mL/min/{1.73_m2} (ref >59)

## 2023-04-29 LAB — THYROID PANEL WITH TSH
Free Thyroxine Index: 1.6 (ref 1.2–4.9)
T3 Uptake Ratio: 27 % (ref 24–39)
T4, Total: 6 ug/dL (ref 4.5–12.0)
TSH: 1.73 u[IU]/mL (ref 0.450–4.500)

## 2023-04-29 LAB — LIPID PANEL
Chol/HDL Ratio: 4.7 ratio — ABNORMAL HIGH (ref 0.0–4.4)
Cholesterol, Total: 199 mg/dL (ref 100–199)
HDL: 42 mg/dL (ref >39)
LDL Chol Calc (NIH): 118 mg/dL — ABNORMAL HIGH (ref 0–99)
Triglycerides: 222 mg/dL — ABNORMAL HIGH (ref 0–149)
VLDL Cholesterol Cal: 39 mg/dL (ref 5–40)

## 2023-04-29 LAB — BAYER DCA HB A1C WAIVED: HB A1C (BAYER DCA - WAIVED): 10.4 % — ABNORMAL HIGH (ref 4.8–5.6)

## 2023-04-29 MED ORDER — METFORMIN HCL 500 MG PO TABS: 500.0000 mg | ORAL_TABLET | Freq: Two times a day (BID) | ORAL | 0 refills | Status: DC

## 2023-04-29 MED ORDER — SEMAGLUTIDE(0.25 OR 0.5MG/DOS) 2 MG/3ML ~~LOC~~ SOPN: 0.5000 mg | PEN_INJECTOR | SUBCUTANEOUS | 1 refills | Status: DC

## 2023-04-29 NOTE — Progress Notes (Signed)
 New dx of diabetes with A1c 10.4 will start her on A1c 10.4 will start Metformin 500 mg BID and Ozempic 0.5 mg Q-7-days

## 2023-05-05 ENCOUNTER — Other Ambulatory Visit (HOSPITAL_COMMUNITY): Payer: Self-pay

## 2023-05-05 ENCOUNTER — Telehealth: Payer: Self-pay

## 2023-05-05 NOTE — Telephone Encounter (Signed)
 Pharmacy Patient Advocate Encounter   Received notification from Pt Calls Messages that prior authorization for Ozempic 0.5 mg is required/requested.   Insurance verification completed.   The patient is insured through [facility] ADVANTAGE/RX ADVANCE .   Per test claim: The current 28 day co-pay is, $190.97.  No PA needed at this time. This test claim was processed through [facility]- copay amounts may vary at other pharmacies due to pharmacy/plan contracts, or as the patient moves through the different stages of their insurance plan.

## 2023-05-05 NOTE — Telephone Encounter (Signed)
 Copied from CRM [id number]. Topic: Clinical - Prescription Issue >> May 05, 2023 10:23 AM Shelah Lewandowsky wrote: Reason for CRM: Semaglutide,0.25 or 0.5MG /DOS, 2 MG/3ML SOPN- needs pre approval for insurance

## 2023-05-07 ENCOUNTER — Other Ambulatory Visit (HOSPITAL_COMMUNITY): Payer: Self-pay

## 2023-05-08 NOTE — Telephone Encounter (Signed)
 Pt informed    LS

## 2023-06-15 ENCOUNTER — Encounter: Payer: Self-pay | Admitting: Nurse Practitioner

## 2023-06-15 ENCOUNTER — Ambulatory Visit (INDEPENDENT_AMBULATORY_CARE_PROVIDER_SITE_OTHER): Admitting: Nurse Practitioner

## 2023-06-15 VITALS — BP 160/85 | HR 91 | Temp 97.5°F | Ht 65.0 in | Wt 230.8 lb

## 2023-06-15 DIAGNOSIS — E119 Type 2 diabetes mellitus without complications: Secondary | ICD-10-CM

## 2023-06-15 DIAGNOSIS — Z7984 Long term (current) use of oral hypoglycemic drugs: Secondary | ICD-10-CM

## 2023-06-15 DIAGNOSIS — E118 Type 2 diabetes mellitus with unspecified complications: Secondary | ICD-10-CM

## 2023-06-15 DIAGNOSIS — Z7985 Long-term (current) use of injectable non-insulin antidiabetic drugs: Secondary | ICD-10-CM

## 2023-06-15 DIAGNOSIS — F32A Depression, unspecified: Secondary | ICD-10-CM

## 2023-06-15 DIAGNOSIS — F419 Anxiety disorder, unspecified: Secondary | ICD-10-CM

## 2023-06-15 MED ORDER — SERTRALINE HCL 25 MG PO TABS: 25.0000 mg | ORAL_TABLET | Freq: Every day | ORAL | 0 refills | Status: DC

## 2023-06-15 NOTE — Progress Notes (Unsigned)
 Established Patient Office Visit  Subjective  Patient ID: Denise Marsh, female    DOB: 1985/02/19  Age: 38 y.o. MRN: [id number]  Chief Complaint  Patient presents with   Medical Management of Chronic Issues    6 week     HPI  [place]  Depression, Follow-up  She  was last seen for this {NUMBERS 1-12:18279} {days/wks/mos/yrs:310907} ago. Changes made at last visit include ***.   She reports {excellent/good/fair/poor:19665} compliance with treatment. She {is/is not:21021397} having side effects. ***  She reports {DESC; GOOD/FAIR/POOR:18685} tolerance of treatment. Current symptoms include: {Symptoms; depression:1002} She feels she is {improved/worse/unchanged:3041574} since last visit.     06/15/2023    9:11 AM 04/28/2023    4:02 PM  Depression screen PHQ 2/9  Decreased Interest 0 1  Down, Depressed, Hopeless 0 2  PHQ - 2 Score 0 3  Altered sleeping 0 0  Tired, decreased energy 0 1  Change in appetite 0 0  Feeling bad or failure about yourself  0 0  Trouble concentrating 0   Moving slowly or fidgety/restless 0   Suicidal thoughts 0   PHQ-9 Score 0 4  Difficult doing work/chores Not difficult at all Not difficult at all     Patient Active Problem List   Diagnosis Date Noted   Type 2 diabetes mellitus without complication, without long-term current use of insulin (HCC) 04/29/2023   Long-term (current) use of injectable non-insulin antidiabetic drugs 04/29/2023   Encounter for general adult medical examination with abnormal findings 04/28/2023   Menorrhagia with irregular cycle 04/28/2023   History of PCOS 04/28/2023   Polydipsia 04/28/2023   Anxiety and depression 04/28/2023   Fatty liver 06/01/2017   Past Medical History:  Diagnosis Date   IBS (irritable bowel syndrome)    PCOS (polycystic ovarian syndrome)    Past Surgical History:  Procedure Laterality Date   APPENDECTOMY     FLEXIBLE SIGMOIDOSCOPY     Social History   Tobacco Use    Smoking status: Never   Smokeless tobacco: Never  Vaping Use   Vaping status: Never Used  Substance Use Topics   Alcohol use: Yes    Comment: occasionally   Drug use: No   Social History   Socioeconomic History   Marital status: Single    Spouse name: Not on file   Number of children: Not on file   Years of education: Not on file   Highest education level: Not on file  Occupational History   Not on file  Tobacco Use   Smoking status: Never   Smokeless tobacco: Never  Vaping Use   Vaping status: Never Used  Substance and Sexual Activity   Alcohol use: Yes    Comment: occasionally   Drug use: No   Sexual activity: Yes    Birth control/protection: Condom  Other Topics Concern   Not on file  Social History Narrative   Not on file   Social Drivers of Health   Financial Resource Strain: Low Risk  (04/28/2023)   Overall Financial Resource Strain (CARDIA)    Difficulty of Paying Living Expenses: Not hard at all  Food Insecurity: No Food Insecurity (04/28/2023)   Hunger Vital Sign    Worried About Running Out of Food in the Last Year: Never true    Ran Out of Food in the Last Year: Never true  Transportation Needs: No Transportation Needs (04/28/2023)   PRAPARE - Administrator, Civil Service (Medical): No  Lack of Transportation (Non-Medical): No  Physical Activity: Sufficiently Active (04/28/2023)   Exercise Vital Sign    Days of Exercise per Week: 3 days    Minutes of Exercise per Session: 150+ min  Stress: Not on file  Social Connections: Not on file  Intimate Partner Violence: Not At Risk (04/28/2023)   Humiliation, Afraid, Rape, and Kick questionnaire    Fear of Current or Ex-Partner: No    Emotionally Abused: No    Physically Abused: No    Sexually Abused: No   Family Status  Relation Name Status   Mother  Alive   Father  Alive   MGM  Deceased   MGF  Deceased   PGM  Alive   PGF  Deceased  No partnership data on file   Family History   Problem Relation Age of Onset   Diabetes Mother    Hypertension Mother    Diabetes Father    Hypertension Father    Leukemia Maternal Grandmother    Diabetes Maternal Grandfather    Hypertension Maternal Grandfather    Diabetes Paternal Grandmother    Hypertension Paternal Grandmother    Diabetes Paternal Grandfather    Hypertension Paternal Grandfather    Allergies  Allergen Reactions   Amoxicillin    Benadryl [Diphenhydramine Hcl (Sleep)]    Nsaids    Penicillins Rash    Has patient had a PCN reaction causing immediate rash, facial/tongue/throat swelling, SOB or lightheadedness with hypotension: No Has patient had a PCN reaction causing severe rash involving mucus membranes or skin necrosis: No Has patient had a PCN reaction that required hospitalization No Has patient had a PCN reaction occurring within the last 10 years: Yes If all of the above answers are "NO", then may proceed with Cephalosporin use.       ROS Negative unless indicated in HPI   Objective:     BP (!) 160/85   Pulse 91   Temp (!) 97.5 F (36.4 C) (Temporal)   Ht 5\' 5"  (1.651 m)   Wt 230 lb 12.8 oz (104.7 kg)   SpO2 98%   BMI 38.41 kg/m  BP Readings from Last 3 Encounters:  06/15/23 (!) 160/85  04/28/23 116/73  09/03/15 141/81   Wt Readings from Last 3 Encounters:  06/15/23 230 lb 12.8 oz (104.7 kg)  04/28/23 236 lb 12.8 oz (107.4 kg)  09/03/15 225 lb (102.1 kg)      Physical Exam   No results found for any visits on 06/15/23.  Last CBC Lab Results  Component Value Date   WBC 9.3 04/28/2023   HGB 13.3 04/28/2023   HCT 41.3 04/28/2023   MCV 86 04/28/2023   MCH 27.5 04/28/2023   RDW 11.7 04/28/2023   PLT 227 04/28/2023   Last metabolic panel Lab Results  Component Value Date   GLUCOSE 340 (H) 04/28/2023   NA 137 04/28/2023   K 4.6 04/28/2023   CL 95 (L) 04/28/2023   CO2 23 04/28/2023   BUN 8 04/28/2023   CREATININE 0.73 04/28/2023   EGFR 108 04/28/2023   CALCIUM  10.3 (H) 04/28/2023   PROT 7.8 04/28/2023   ALBUMIN 4.7 04/28/2023   LABGLOB 3.1 04/28/2023   BILITOT 0.2 04/28/2023   ALKPHOS 126 (H) 04/28/2023   AST 33 04/28/2023   ALT 57 (H) 04/28/2023   Last lipids Lab Results  Component Value Date   CHOL 199 04/28/2023   HDL 42 04/28/2023   LDLCALC 118 (H) 04/28/2023   TRIG 222 (  H) 04/28/2023   CHOLHDL 4.7 (H) 04/28/2023   Last hemoglobin A1c Lab Results  Component Value Date   HGBA1C 10.4 (H) 04/28/2023   Last thyroid  functions Lab Results  Component Value Date   TSH 1.730 04/28/2023   T4TOTAL 6.0 04/28/2023        Assessment & Plan:  Type 2 diabetes mellitus without complication, without long-term current use of insulin (HCC) -     Microalbumin / creatinine urine ratio  Anxiety and depression -     Sertraline  HCl; Take 1 tablet (25 mg total) by mouth daily.  Dispense: 90 tablet; Refill: 0    Encourage healthy lifestyle choices, including diet (rich in fruits, vegetables, and lean proteins, and low in salt and simple carbohydrates) and exercise (at least 30 minutes of moderate physical activity daily).     The above assessment and management plan was discussed with the patient. The patient verbalized understanding of and has agreed to the management plan. Patient is aware to call the clinic if they develop any new symptoms or if symptoms persist or worsen. Patient is aware when to return to the clinic for a follow-up visit. Patient educated on when it is appropriate to go to the emergency department.  Return for as already scheduled.    Layloni Fahrner [place] Thompson, DNP [facility] [address] [place], [place] [address] [phone]    Note: This document was prepared by [employer] voice dictation technology and any errors that results from this process are unintentional.

## 2023-06-18 ENCOUNTER — Other Ambulatory Visit: Payer: Self-pay | Admitting: Nurse Practitioner

## 2023-06-18 ENCOUNTER — Telehealth: Payer: Self-pay

## 2023-06-18 DIAGNOSIS — E119 Type 2 diabetes mellitus without complications: Secondary | ICD-10-CM

## 2023-06-18 MED ORDER — DEXCOM G7 SENSOR MISC: 1.0000 | 2 refills | Status: DC

## 2023-06-18 MED ORDER — DEXCOM G7 RECEIVER DEVI: 1.0000 | Freq: Two times a day (BID) | 0 refills | Status: DC

## 2023-06-18 NOTE — Addendum Note (Signed)
 Addended by: Kyra Phy on: 06/18/2023 03:50 PM   Modules accepted: Orders

## 2023-06-18 NOTE — Telephone Encounter (Signed)
 Pt is aware rx is being sent to [facility] pharmacy

## 2023-06-18 NOTE — Telephone Encounter (Signed)
 Fax phone call. Pt called requesting a dexcom to be prescribed for her

## 2023-06-19 ENCOUNTER — Other Ambulatory Visit (HOSPITAL_COMMUNITY): Payer: Self-pay

## 2023-06-19 ENCOUNTER — Telehealth: Payer: Self-pay

## 2023-06-19 NOTE — Telephone Encounter (Signed)
 Pharmacy Patient Advocate Encounter   Received notification from Onbase that prior authorization for Dexcom G7 Receiver device is required/requested.   Insurance verification completed.   The patient is insured through [facility] .   Per test claim: PA required; PA submitted to above mentioned insurance via CoverMyMeds Key/confirmation #/EOC [id number] Status is pending

## 2023-06-19 NOTE — Telephone Encounter (Signed)
 Pharmacy Patient Advocate Encounter  Received notification from [facility] that Prior Authorization for Dexcom G7 Receiver device has been DENIED.  See denial reason below. No denial letter attached in CMM. Will attach denial letter to Media tab once received.   PA #/Case ID/Reference #: [id number]

## 2023-06-22 NOTE — Telephone Encounter (Signed)
 Pt aware and verbalized understanding.

## 2023-07-04 ENCOUNTER — Other Ambulatory Visit: Payer: Self-pay | Admitting: Nurse Practitioner

## 2023-07-04 DIAGNOSIS — E119 Type 2 diabetes mellitus without complications: Secondary | ICD-10-CM

## 2023-07-28 ENCOUNTER — Other Ambulatory Visit: Payer: Self-pay | Admitting: Nurse Practitioner

## 2023-07-28 DIAGNOSIS — E119 Type 2 diabetes mellitus without complications: Secondary | ICD-10-CM

## 2023-08-04 ENCOUNTER — Ambulatory Visit: Admitting: Nurse Practitioner

## 2023-08-31 NOTE — Progress Notes (Unsigned)
   Established Patient Office Visit  Subjective  Patient ID: Denise Marsh Core, female    DOB: [birth year]  Age: 38 y.o. MRN: [id number]  No chief complaint on file.   HPI  {History (Optional):23778}  ROS Negative unless indicated in HPI   Objective:     There were no vitals taken for this visit. {Vitals History (Optional):23777}  Physical Exam   No results found for any visits on 09/01/23.  {Labs (Optional):23779}    Assessment & Plan:  There are no diagnoses linked to this encounter.  No follow-ups on file.    @Ellakate Gonsalves  [place], [place]    Note: This document was prepared by [employer] voice dictation technology and any errors that results from this process are unintentional.

## 2023-09-01 ENCOUNTER — Ambulatory Visit: Admitting: *Deleted

## 2023-09-01 ENCOUNTER — Encounter: Payer: Self-pay | Admitting: Nurse Practitioner

## 2023-09-01 ENCOUNTER — Ambulatory Visit (INDEPENDENT_AMBULATORY_CARE_PROVIDER_SITE_OTHER): Admitting: Nurse Practitioner

## 2023-09-01 VITALS — BP 130/77 | HR 97 | Temp 98.9°F | Ht 65.0 in | Wt 229.0 lb

## 2023-09-01 DIAGNOSIS — Z7984 Long term (current) use of oral hypoglycemic drugs: Secondary | ICD-10-CM

## 2023-09-01 DIAGNOSIS — E119 Type 2 diabetes mellitus without complications: Secondary | ICD-10-CM

## 2023-09-01 DIAGNOSIS — F419 Anxiety disorder, unspecified: Secondary | ICD-10-CM | POA: Diagnosis not present

## 2023-09-01 DIAGNOSIS — F32A Depression, unspecified: Secondary | ICD-10-CM | POA: Diagnosis not present

## 2023-09-01 LAB — HM DIABETES EYE EXAM

## 2023-09-01 MED ORDER — SERTRALINE HCL 25 MG PO TABS: 25.0000 mg | ORAL_TABLET | Freq: Every day | ORAL | 0 refills | Status: DC

## 2023-09-01 MED ORDER — SEMAGLUTIDE (1 MG/DOSE) 4 MG/3ML ~~LOC~~ SOPN: 1.0000 mg | PEN_INJECTOR | SUBCUTANEOUS | 0 refills | Status: DC

## 2023-09-01 MED ORDER — METFORMIN HCL 500 MG PO TABS: 500.0000 mg | ORAL_TABLET | Freq: Two times a day (BID) | ORAL | 0 refills | Status: DC

## 2023-09-01 NOTE — Addendum Note (Signed)
 Addended by: MILAS KENT D on: 09/01/2023 03:38 PM   Modules accepted: Orders

## 2023-09-01 NOTE — Progress Notes (Signed)
 Arrived on 09/01/2023 and has given verbal consent to obtain images and complete their overdue diabetic retinal screening.  The images have been sent to an ophthalmologist or optometrist for review and interpretation.  Results will be sent back to [facility] for review.  Patient has been informed they will be contacted when we receive the results via telephone or MyChart.

## 2023-09-02 LAB — HGB A1C W/O EAG: Hgb A1c MFr Bld: 5.7 % — ABNORMAL HIGH (ref 4.8–5.6)

## 2023-09-03 LAB — MICROALBUMIN / CREATININE URINE RATIO
Creatinine, Urine: 271.3 mg/dL
Microalb/Creat Ratio: 10 mg/g{creat} (ref 0–29)
Microalbumin, Urine: 27.9 ug/mL

## 2023-09-07 ENCOUNTER — Ambulatory Visit: Payer: Self-pay | Admitting: Nurse Practitioner

## 2023-09-09 ENCOUNTER — Other Ambulatory Visit: Payer: Self-pay | Admitting: Nurse Practitioner

## 2023-09-09 DIAGNOSIS — F32A Depression, unspecified: Secondary | ICD-10-CM

## 2023-09-14 ENCOUNTER — Ambulatory Visit: Payer: Self-pay | Admitting: Nurse Practitioner

## 2023-09-14 ENCOUNTER — Ambulatory Visit: Payer: Self-pay

## 2023-09-14 NOTE — Telephone Encounter (Signed)
 Unable to hear patient or specialist, call dropped. Called patient back, left VM to return the call to the office.   Copied from CRM [id number]. Topic: Clinical - Red Word Triage >> Sep 14, 2023 10:53 AM Corin V wrote: [facility] that prompted transfer to Nurse Triage: Patient is having back pain in her tailbone and want to know about over the counter meds she can take. Pain ranges from 7-10/10 depending on the day.

## 2023-09-14 NOTE — Telephone Encounter (Signed)
Noted  -LS

## 2023-09-14 NOTE — Telephone Encounter (Signed)
  FYI Only or Action Required?: FYI only for provider.  Patient was last seen in primary care on 09/01/2023 by Deitra Morton Sebastian Nena, NP.  Called Nurse Triage reporting Tailbone Pain.  Symptoms began several months ago.  Interventions attempted: OTC medications: tylenol.  Symptoms are: unchanged.  Triage Disposition: See PCP When Office is Open (Within 3 Days) Will come in next week due to patient's schedule  Patient/caregiver understands and will follow disposition?: Yes   Reason for Disposition  [1] MODERATE back pain (e.g., interferes with normal activities) AND [2] present > 3 days  Answer Assessment - Initial Assessment Questions 1. ONSET: When did the pain begin? (e.g., minutes, hours, days)     About two months ago 2. LOCATION: Where does it hurt? (upper, mid or lower back)     tailbone 3. SEVERITY: How bad is the pain?  (e.g., Scale 1-10; mild, moderate, or severe)     Anywhere from 3/10-10/10.   Much worse with sitting 4. PATTERN: Is the pain constant? (e.g., yes, no; constant, intermittent)      constant 5. RADIATION: Does the pain shoot into your legs or somewhere else?     denies 6. CAUSE:  What do you think is causing the back pain?      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 BACK OVERUSE:  Any recent lifting of heavy objects, strenuous work or exercise?     Prolonged standing 8. MEDICINES: What have you taken so far for the pain? (e.g., nothing, acetaminophen, NSAIDS)     Tylenol, relieves for 2-3 hours, then pain comes back 9. NEUROLOGIC SYMPTOMS: Do you have any weakness, numbness, or problems with bowel/bladder control?     denies  11. PREGNANCY: Is there any chance you are pregnant? When was your last menstrual period?       denies  Protocols used: Back Pain-A-AH

## 2023-09-21 NOTE — Progress Notes (Unsigned)
   Acute Office Visit  Subjective:     Patient ID: Denise Marsh, female    DOB: 01/28/1986, 38 y.o.   MRN: [id number]  No chief complaint on file.   HPI Denise Marsh Active Ambulatory Problems    Diagnosis Date Noted   Encounter for general adult medical examination with abnormal findings 04/28/2023   Menorrhagia with irregular cycle 04/28/2023   History of PCOS 04/28/2023   Polydipsia 04/28/2023   Fatty liver 06/01/2017   Anxiety and depression 04/28/2023   Type 2 diabetes mellitus without complication, without long-term current use of insulin (HCC) 04/29/2023   Long-term (current) use of injectable non-insulin antidiabetic drugs 04/29/2023   Resolved Ambulatory Problems    Diagnosis Date Noted   No Resolved Ambulatory Problems   Past Medical History:  Diagnosis Date   IBS (irritable bowel syndrome)    PCOS (polycystic ovarian syndrome)     ROS Negative unless indicated in HPI    Objective:    There were no vitals taken for this visit. BP Readings from Last 3 Encounters:  09/01/23 130/77  06/15/23 (!) 160/85  04/28/23 116/73   Wt Readings from Last 3 Encounters:  09/01/23 229 lb (103.9 kg)  06/15/23 230 lb 12.8 oz (104.7 kg)  04/28/23 236 lb 12.8 oz (107.4 kg)      Physical Exam Pertinent labs & imaging results that were available during my care of the patient were reviewed by me and considered in my medical decision making.  No results found for any visits on 09/22/23.      Assessment & Plan:  There are no diagnoses linked to this encounter.  No follow-ups on file.  Hakim Minniefield [place] Thompson, DNP [facility] [address] [place], [place] [address] [phone]  Note: This document was prepared by [employer] voice dictation technology and any errors that results from this process are unintentional.

## 2023-09-22 ENCOUNTER — Ambulatory Visit (INDEPENDENT_AMBULATORY_CARE_PROVIDER_SITE_OTHER): Admitting: Nurse Practitioner

## 2023-09-22 ENCOUNTER — Ambulatory Visit (INDEPENDENT_AMBULATORY_CARE_PROVIDER_SITE_OTHER)

## 2023-09-22 ENCOUNTER — Encounter: Payer: Self-pay | Admitting: Nurse Practitioner

## 2023-09-22 VITALS — BP 139/81 | HR 66 | Temp 98.3°F | Ht 65.0 in | Wt 228.2 lb

## 2023-09-22 DIAGNOSIS — M5489 Other dorsalgia: Secondary | ICD-10-CM | POA: Diagnosis not present

## 2023-09-22 DIAGNOSIS — M549 Dorsalgia, unspecified: Secondary | ICD-10-CM | POA: Insufficient documentation

## 2023-09-22 MED ORDER — METHOCARBAMOL 500 MG PO TABS: 500.0000 mg | ORAL_TABLET | Freq: Three times a day (TID) | ORAL | 0 refills | Status: DC | PRN

## 2023-09-22 MED ORDER — PREDNISONE 10 MG (21) PO TBPK: ORAL_TABLET | ORAL | 0 refills | Status: DC

## 2023-09-28 ENCOUNTER — Other Ambulatory Visit: Payer: Self-pay | Admitting: Nurse Practitioner

## 2023-09-28 DIAGNOSIS — E119 Type 2 diabetes mellitus without complications: Secondary | ICD-10-CM

## 2023-10-05 ENCOUNTER — Ambulatory Visit: Payer: Self-pay | Admitting: Nurse Practitioner

## 2023-12-01 DIAGNOSIS — F331 Major depressive disorder, recurrent, moderate: Secondary | ICD-10-CM | POA: Insufficient documentation

## 2023-12-01 NOTE — Progress Notes (Signed)
 Subjective:  Patient ID: Denise Marsh, female    DOB: Jun 10, 1985, 38 y.o.   MRN: [id number]  Patient Care Team: Deitra Morton Sebastian Nena, NP as PCP - General (Nurse Practitioner)   Chief Complaint:  No chief complaint on file.   HPI: Denise Marsh is a 38 y.o. female presenting on 12/02/2023 for No chief complaint on file.   Discussed the use of AI scribe software for clinical note transcription with the patient, who gave verbal consent to proceed.  History of Present Illness       Relevant past medical, surgical, family, and social history reviewed and updated as indicated.  Allergies and medications reviewed and updated. Data reviewed: Chart in Epic.   Past Medical History:  Diagnosis Date   IBS (irritable bowel syndrome)    PCOS (polycystic ovarian syndrome)     Past Surgical History:  Procedure Laterality Date   APPENDECTOMY     FLEXIBLE SIGMOIDOSCOPY      Social History   Socioeconomic History   Marital status: Single    Spouse name: Not on file   Number of children: Not on file   Years of education: Not on file   Highest education level: Not on file  Occupational History   Not on file  Tobacco Use   Smoking status: Never   Smokeless tobacco: Never  Vaping Use   Vaping status: Never Used  Substance and Sexual Activity   Alcohol use: Yes    Comment: occasionally   Drug use: No   Sexual activity: Yes    Birth control/protection: Condom  Other Topics Concern   Not on file  Social History Narrative   Not on file   Social Drivers of Health   Financial Resource Strain: Low Risk  (04/28/2023)   Overall Financial Resource Strain (CARDIA)    Difficulty of Paying Living Expenses: Not hard at all  Food Insecurity: No Food Insecurity (04/28/2023)   Hunger Vital Sign    Worried About Running Out of Food in the Last Year: Never true    Ran Out of Food in the Last Year: Never true  Transportation Needs: No Transportation Needs (04/28/2023)    PRAPARE - Administrator, Civil Service (Medical): No    Lack of Transportation (Non-Medical): No  Physical Activity: Sufficiently Active (04/28/2023)   Exercise Vital Sign    Days of Exercise per Week: 3 days    Minutes of Exercise per Session: 150+ min  Stress: Not on file  Social Connections: Not on file  Intimate Partner Violence: Not At Risk (04/28/2023)   Humiliation, Afraid, Rape, and Kick questionnaire    Fear of Current or Ex-Partner: No    Emotionally Abused: No    Physically Abused: No    Sexually Abused: No    Outpatient Encounter Medications as of 12/02/2023  Medication Sig   metFORMIN  (GLUCOPHAGE ) 500 MG tablet Take 1 tablet (500 mg total) by mouth 2 (two) times daily with a meal.   methocarbamol  (ROBAXIN ) 500 MG tablet Take 1 tablet (500 mg total) by mouth every 8 (eight) hours as needed for muscle spasms.   predniSONE  (STERAPRED UNI-PAK 21 TAB) 10 MG (21) TBPK tablet Use as directed on back of pill pack   Semaglutide , 1 MG/DOSE, 4 MG/3ML SOPN Inject 1 mg as directed once a week.   sertraline  (ZOLOFT ) 25 MG tablet Take 1 tablet (25 mg total) by mouth daily.   No facility-administered encounter medications on file as  of 12/02/2023.    Allergies  Allergen Reactions   Amoxicillin    Benadryl [Diphenhydramine Hcl (Sleep)]    Nsaids    Penicillins Rash    Has patient had a PCN reaction causing immediate rash, facial/tongue/throat swelling, SOB or lightheadedness with hypotension: No Has patient had a PCN reaction causing severe rash involving mucus membranes or skin necrosis: No Has patient had a PCN reaction that required hospitalization No Has patient had a PCN reaction occurring within the last 10 years: Yes If all of the above answers are NO, then may proceed with Cephalosporin use.     Pertinent ROS per HPI, otherwise unremarkable      Objective:  There were no vitals taken for this visit.   Wt Readings from Last 3 Encounters:  09/22/23 228  lb 3.2 oz (103.5 kg)  09/01/23 229 lb (103.9 kg)  06/15/23 230 lb 12.8 oz (104.7 kg)    Physical Exam Physical Exam      Results for orders placed or performed in visit on 09/11/23  HM DIABETES EYE EXAM   Collection Time: 09/01/23  3:38 PM  Result Value Ref Range   HM Diabetic Eye Exam No Retinopathy No Retinopathy       Pertinent labs & imaging results that were available during my care of the patient were reviewed by me and considered in my medical decision making.  Assessment & Plan:  There are no diagnoses linked to this encounter.   Assessment and Plan Assessment & Plan       Continue all other maintenance medications.  Follow up plan: No follow-ups on file.   Continue healthy lifestyle choices, including diet (rich in fruits, vegetables, and lean proteins, and low in salt and simple carbohydrates) and exercise (at least 30 minutes of moderate physical activity daily).  Educational handout given for ***  The above assessment and management plan was discussed with the patient. The patient verbalized understanding of and has agreed to the management plan. Patient is aware to call the clinic if they develop any new symptoms or if symptoms persist or worsen. Patient is aware when to return to the clinic for a follow-up visit. Patient educated on when it is appropriate to go to the emergency department.   Geraldine Sandberg [place] Thompson, DNP [facility] [address] [place], [place] [address] [phone]

## 2023-12-02 ENCOUNTER — Ambulatory Visit (INDEPENDENT_AMBULATORY_CARE_PROVIDER_SITE_OTHER): Admitting: Nurse Practitioner

## 2023-12-02 ENCOUNTER — Encounter: Payer: Self-pay | Admitting: Nurse Practitioner

## 2023-12-02 VITALS — BP 151/83 | HR 79 | Temp 97.6°F | Ht 65.0 in | Wt 225.2 lb

## 2023-12-02 DIAGNOSIS — E119 Type 2 diabetes mellitus without complications: Secondary | ICD-10-CM

## 2023-12-02 DIAGNOSIS — F419 Anxiety disorder, unspecified: Secondary | ICD-10-CM | POA: Diagnosis not present

## 2023-12-02 DIAGNOSIS — F331 Major depressive disorder, recurrent, moderate: Secondary | ICD-10-CM | POA: Diagnosis not present

## 2023-12-02 DIAGNOSIS — Z6837 Body mass index (BMI) 37.0-37.9, adult: Secondary | ICD-10-CM

## 2023-12-02 DIAGNOSIS — Z7984 Long term (current) use of oral hypoglycemic drugs: Secondary | ICD-10-CM

## 2023-12-02 DIAGNOSIS — E66812 Obesity, class 2: Secondary | ICD-10-CM

## 2023-12-02 DIAGNOSIS — L680 Hirsutism: Secondary | ICD-10-CM

## 2023-12-02 DIAGNOSIS — F32A Depression, unspecified: Secondary | ICD-10-CM

## 2023-12-02 DIAGNOSIS — R030 Elevated blood-pressure reading, without diagnosis of hypertension: Secondary | ICD-10-CM

## 2023-12-02 LAB — BAYER DCA HB A1C WAIVED: HB A1C (BAYER DCA - WAIVED): 4.9 % (ref 4.8–5.6)

## 2023-12-02 MED ORDER — SPIRONOLACTONE 25 MG PO TABS: 25.0000 mg | ORAL_TABLET | Freq: Every day | ORAL | 3 refills | Status: DC

## 2023-12-02 MED ORDER — SERTRALINE HCL 25 MG PO TABS: 25.0000 mg | ORAL_TABLET | Freq: Every day | ORAL | 0 refills | Status: DC

## 2023-12-02 MED ORDER — SEMAGLUTIDE (1 MG/DOSE) 4 MG/3ML ~~LOC~~ SOPN: 1.0000 mg | PEN_INJECTOR | SUBCUTANEOUS | 0 refills | Status: DC

## 2023-12-02 MED ORDER — METFORMIN HCL 500 MG PO TABS: 500.0000 mg | ORAL_TABLET | Freq: Two times a day (BID) | ORAL | 0 refills | Status: DC

## 2023-12-03 ENCOUNTER — Ambulatory Visit: Payer: Self-pay | Admitting: Nurse Practitioner

## 2024-01-07 ENCOUNTER — Telehealth (INDEPENDENT_AMBULATORY_CARE_PROVIDER_SITE_OTHER): Payer: Self-pay | Admitting: Nurse Practitioner

## 2024-01-07 ENCOUNTER — Ambulatory Visit: Payer: Self-pay

## 2024-01-07 DIAGNOSIS — R112 Nausea with vomiting, unspecified: Secondary | ICD-10-CM

## 2024-01-07 DIAGNOSIS — A084 Viral intestinal infection, unspecified: Secondary | ICD-10-CM

## 2024-01-07 DIAGNOSIS — R197 Diarrhea, unspecified: Secondary | ICD-10-CM

## 2024-01-07 DIAGNOSIS — K2970 Gastritis, unspecified, without bleeding: Secondary | ICD-10-CM | POA: Insufficient documentation

## 2024-01-07 MED ORDER — ONDANSETRON HCL 4 MG PO TABS: 4.0000 mg | ORAL_TABLET | Freq: Three times a day (TID) | ORAL | 0 refills | Status: AC | PRN

## 2024-01-07 MED ORDER — LOPERAMIDE HCL 2 MG PO TABS: 2.0000 mg | ORAL_TABLET | Freq: Four times a day (QID) | ORAL | 0 refills | Status: AC | PRN

## 2024-01-07 NOTE — Telephone Encounter (Signed)
 FYI Only or Action Required?: FYI only for provider: appointment scheduled on vv scheduled for today.  Patient was last seen in primary care on 12/02/2023 by Denise Morton Sebastian Nena, NP.  Called Nurse Triage reporting Abdominal Cramping. Pt had vomiting and diarrhea for several days earlier this week. Now pt has abdominal pain and migraine  Symptoms began Monday.  Interventions attempted: OTC medications: kaopectate.  Symptoms are: no more vomiting and diarrhea. However now has abdominal pain and HA.  Triage Disposition: See HCP Within 4 Hours (Or PCP Triage)  Patient/caregiver understands and will follow disposition?: Yes                    Copied from CRM #[id number]. Topic: Clinical - Red Word Triage >> Jan 07, 2024  9:13 AM Victoria B wrote: [facility] that prompted transfer to Nurse Triage: patient had diarrhea, hasn't had a bowel movement in 15 hours Reason for Disposition  [1] MILD-MODERATE pain AND [2] constant AND [3] present > 2 hours  Answer Assessment - Initial Assessment Questions 1. LOCATION: Where does it hurt?      Top of bladder 2. RADIATION: Does the pain shoot anywhere else? (e.g., chest, back)     Lower abdomen and back 3. ONSET: When did the pain begin? (e.g., minutes, hours or days ago)      Monday afternoon 4. SUDDEN: Gradual or sudden onset?     sudden 5. PATTERN Does the pain come and go, or is it constant?     Constant - giving her a migraine 6. SEVERITY: How bad is the pain?  (e.g., Scale 1-10; mild, moderate, or severe)     Migraine and stomach 7-8/10 7. RECURRENT SYMPTOM: Have you ever had this type of stomach pain before? If Yes, ask: When was the last time? and What happened that time?      Yes - appendix. Food poisoning 2010 8. CAUSE: What do you think is causing the stomach pain? (e.g., gallstones, recent abdominal surgery)     May have gotten bad food 9. RELIEVING/AGGRAVATING FACTORS: What makes it better or  worse? (e.g., antacids, bending or twisting motion, bowel movement)     nothing 10. OTHER SYMPTOMS: Do you have any other symptoms? (e.g., back pain, diarrhea, fever, urination pain, vomiting)       migraine 11. PREGNANCY: Is there any chance you are pregnant? When was your last menstrual period?       no  Protocols used: Abdominal Pain - [facility]

## 2024-01-07 NOTE — Telephone Encounter (Signed)
 APPT MADE

## 2024-01-07 NOTE — Progress Notes (Signed)
 Virtual Visit via video Note Due to COVID-19 pandemic this visit was conducted virtually. This visit type was conducted due to national recommendations for restrictions regarding the COVID-19 Pandemic (e.g. social distancing, sheltering in place) in an effort to limit this patient's exposure and mitigate transmission in our community. All issues noted in this document were discussed and addressed.  A physical exam was not performed with this format.   I connected with Denise Marsh on 01/07/2024 at 1140 by name and DOB and verified that I am speaking with the correct person using two identifiers. Denise Marsh is currently located at home and during visit. The provider, Nena Deitra Morton Sebastian, DNP is located in their office at time of visit.  I discussed the limitations, risks, security and privacy concerns of performing an evaluation and management service by virtual visit and the availability of in person appointments. I also discussed with the patient that there may be a patient responsible charge related to this service. The patient expressed understanding and agreed to proceed.  Subjective:  Patient ID: Denise Marsh, female    DOB: 1985/11/08, 38 y.o.   MRN: [id number]  Chief Complaint:  Diarrhea (N/V for 3 days, took kaopectate yesterday and had biscuit and Gravy this morning)   HPI: Denise Marsh is a 38 y.o. female presenting on 01/07/2024 for Diarrhea (N/V for 3 days, took kaopectate yesterday and had biscuit and Gravy this morning)  A 38 year old female presents today with a 3-day history of nausea and vomiting. She took Kaopectate yesterday with some relief. She ate biscuits and gravy this morning and has not had a bowel movement today. She reports missing school yesterday due to symptoms but feels well enough to return today. She denies any abdominal pain, fever, chills, diarrhea, or urinary symptoms.   Relevant past medical, surgical, family, and social history reviewed  and updated as indicated.  Allergies and medications reviewed and updated.   Past Medical History:  Diagnosis Date   IBS (irritable bowel syndrome)    PCOS (polycystic ovarian syndrome)     Past Surgical History:  Procedure Laterality Date   APPENDECTOMY     FLEXIBLE SIGMOIDOSCOPY      Social History   Socioeconomic History   Marital status: Single    Spouse name: Not on file   Number of children: Not on file   Years of education: Not on file   Highest education level: Associate degree: academic program  Occupational History   Not on file  Tobacco Use   Smoking status: Never   Smokeless tobacco: Never  Vaping Use   Vaping status: Never Used  Substance and Sexual Activity   Alcohol use: Yes    Comment: occasionally   Drug use: No   Sexual activity: Yes    Birth control/protection: Condom  Other Topics Concern   Not on file  Social History Narrative   Not on file   Social Drivers of Health   Financial Resource Strain: Low Risk  (01/07/2024)   Overall Financial Resource Strain (CARDIA)    Difficulty of Paying Living Expenses: Not hard at all  Food Insecurity: No Food Insecurity (01/07/2024)   Hunger Vital Sign    Worried About Running Out of Food in the Last Year: Never true    Ran Out of Food in the Last Year: Never true  Transportation Needs: No Transportation Needs (01/07/2024)   PRAPARE - Transportation    Lack of Transportation (Medical): No    Lack  of Transportation (Non-Medical): No  Physical Activity: Insufficiently Active (01/07/2024)   Exercise Vital Sign    Days of Exercise per Week: 3 days    Minutes of Exercise per Session: 40 min  Stress: No Stress Concern Present (01/07/2024)   [employer] of Occupational Health - Occupational Stress Questionnaire    Feeling of Stress: Not at all  Social Connections: Moderately Integrated (01/07/2024)   Social Connection and Isolation Panel    Frequency of Communication with Friends and Family: More  than three times a week    Frequency of Social Gatherings with Friends and Family: More than three times a week    Attends Religious Services: More than 4 times per year    Active Member of [employer] or Organizations: Yes    Attends Banker Meetings: More than 4 times per year    Marital Status: Never married  Intimate Partner Violence: Not At Risk (04/28/2023)   Humiliation, Afraid, Rape, and Kick questionnaire    Fear of Current or Ex-Partner: No    Emotionally Abused: No    Physically Abused: No    Sexually Abused: No    Outpatient Encounter Medications as of 01/07/2024  Medication Sig   loperamide (IMODIUM A-D) 2 MG tablet Take 1 tablet (2 mg total) by mouth 4 (four) times daily as needed for diarrhea or loose stools.   ondansetron (ZOFRAN) 4 MG tablet Take 1 tablet (4 mg total) by mouth every 8 (eight) hours as needed for nausea or vomiting.   metFORMIN  (GLUCOPHAGE ) 500 MG tablet Take 1 tablet (500 mg total) by mouth 2 (two) times daily with a meal.   Semaglutide , 1 MG/DOSE, 4 MG/3ML SOPN Inject 1 mg as directed once a week.   sertraline  (ZOLOFT ) 25 MG tablet Take 1 tablet (25 mg total) by mouth daily.   spironolactone (ALDACTONE) 25 MG tablet Take 1 tablet (25 mg total) by mouth daily.   No facility-administered encounter medications on file as of 01/07/2024.    Allergies  Allergen Reactions   Amoxicillin    Benadryl [Diphenhydramine Hcl (Sleep)]    Nsaids    Penicillins Rash    Has patient had a PCN reaction causing immediate rash, facial/tongue/throat swelling, SOB or lightheadedness with hypotension: No Has patient had a PCN reaction causing severe rash involving mucus membranes or skin necrosis: No Has patient had a PCN reaction that required hospitalization No Has patient had a PCN reaction occurring within the last 10 years: Yes If all of the above answers are NO, then may proceed with Cephalosporin use.     Review of Systems  Constitutional:  Negative  for chills and fever.  HENT:  Negative for congestion and sinus pressure.   Respiratory:  Negative for chest tightness and wheezing.   Cardiovascular:  Negative for chest pain and leg swelling.  Gastrointestinal:  Positive for diarrhea, nausea and vomiting. Negative for abdominal pain and blood in stool.  Neurological:  Negative for dizziness and headaches.     Observations/Objective: No vital signs or physical exam, this was a virtual health encounter.  Pt alert and oriented, answers all questions appropriately, and able to speak in full sentences.  Physical Exam HENT:     Head: Normocephalic and atraumatic.  Eyes:     Extraocular Movements: Extraocular movements intact.     Conjunctiva/sclera: Conjunctivae normal.     Pupils: Pupils are equal, round, and reactive to light.  Pulmonary:     Effort: Pulmonary effort is normal.  Neurological:  Mental Status: She is alert and oriented to person, place, and time.  Psychiatric:        Mood and Affect: Mood normal.        Behavior: Behavior normal.        Thought Content: Thought content normal.        Judgment: Judgment normal.      Assessment and Plan: Vivi was seen today for diarrhea.  Diagnoses and all orders for this visit:  Viral gastritis -     ondansetron  (ZOFRAN ) 4 MG tablet; Take 1 tablet (4 mg total) by mouth every 8 (eight) hours as needed for nausea or vomiting. -     loperamide  (IMODIUM  A-D) 2 MG tablet; Take 1 tablet (2 mg total) by mouth 4 (four) times daily as needed for diarrhea or loose stools.  Nausea and vomiting, unspecified vomiting type -     ondansetron  (ZOFRAN ) 4 MG tablet; Take 1 tablet (4 mg total) by mouth every 8 (eight) hours as needed for nausea or vomiting. -     loperamide  (IMODIUM  A-D) 2 MG tablet; Take 1 tablet (2 mg total) by mouth 4 (four) times daily as needed for diarrhea or loose stools.  Diarrhea, unspecified type -     ondansetron  (ZOFRAN ) 4 MG tablet; Take 1 tablet (4 mg total)  by mouth every 8 (eight) hours as needed for nausea or vomiting. -     loperamide  (IMODIUM  A-D) 2 MG tablet; Take 1 tablet (2 mg total) by mouth 4 (four) times daily as needed for diarrhea or loose stools.   Jaquasia is a 38 year old African-American female seen today via telehealth for viral gastritis, no acute distress Assessment:  Viral gastroenteritis with nausea, vomiting, and diarrhea.  Plan: -Ondansetron  (Zofran ) as needed for nausea/vomiting. -Loperamide  (Imodium ) as needed for diarrhea. -Hydration: Encourage oral fluids to prevent dehydration. -Diet: Advance to a bland diet as tolerated (e.g., toast, rice, bananas, applesauce). -Return precautions: Seek care if vomiting persists, unable to tolerate fluids, develops abdominal pain, fever, or signs of dehydration.  Follow Up Instructions: Return if symptoms worsen or fail to improve.    I discussed the assessment and treatment plan with the patient. The patient was provided an opportunity to ask questions and all were answered. The patient agreed with the plan and demonstrated an understanding of the instructions.   The patient was advised to call back or seek an in-person evaluation if the symptoms worsen or if the condition fails to improve as anticipated.  The above assessment and management plan was discussed with the patient. The patient verbalized understanding of and has agreed to the management plan. Patient is aware to call the clinic if they develop any new symptoms or if symptoms persist or worsen. Patient is aware when to return to the clinic for a follow-up visit. Patient educated on when it is appropriate to go to the emergency department.     Clinical References  Diarrhea, Adult Diarrhea is when you pass loose and sometimes watery poop (stool) often. Diarrhea can make you feel weak and cause you to lose water in your body (get dehydrated). Losing water in your body can cause you to: Feel tired and thirsty. Have a  dry mouth. Go pee (urinate) less often. Diarrhea often lasts 2-3 days. It can last longer if it is a sign of something more serious. Be sure to treat your diarrhea as told by your doctor. Follow these instructions at home: Eating and drinking     Follow these  instructions as told by your doctor: Take an ORS (oral rehydration solution). This is a drink that helps you replace fluids and minerals your body lost. It is sold at pharmacies and stores. Drink enough fluid to keep your pee (urine) pale yellow. Drink fluids such as: Water. You can also get fluids by sucking on ice chips. Diluted fruit juice. Low-calorie sports drinks. Milk. Avoid drinking fluids that have a lot of sugar or caffeine in them. These include soda, energy drinks, and regular sports drinks. Avoid alcohol. Eat bland, easy-to-digest foods in small amounts as you are able. These foods include: Bananas. Applesauce. Rice. Low-fat (lean) meats. Toast. Crackers. Avoid spicy or fatty foods.   Medicines Take over-the-counter and prescription medicines only as told by your doctor. If you were prescribed antibiotics, take them as told by your doctor. Do not stop taking them even if you start to feel better. General instructions  Wash your hands often using soap and water for 20 seconds. If soap and water are not available, use hand sanitizer. Others in your home should wash their hands as well. Wash your hands: After using the toilet or changing a diaper. Before preparing, cooking, or serving food. While caring for a sick person. While visiting someone in a hospital. Rest at home while you get better. Take a warm bath to help with any burning or pain from having diarrhea. Watch your condition for any changes. Contact a doctor if: You have a fever. Your diarrhea gets worse. You have new symptoms. You vomit every time you eat or drink. You feel light-headed, dizzy, or you have a headache. You have muscle  cramps. You have signs of losing too much water in your body, such as: Dark pee, very little pee, or no pee. Cracked lips. Dry mouth. Sunken eyes. Sleepiness. Weakness. You have bloody or black poop or poop that looks like tar. You have very bad pain, cramping, or bloating in your belly (abdomen). Your skin feels cold and clammy. You feel confused. Get help right away if: You have chest pain. Your heart is beating very quickly. You have trouble breathing or you are breathing very quickly. You feel very weak or you faint. These symptoms may be an emergency. Get help right away. Call 911. Do not wait to see if the symptoms will go away. Do not drive yourself to the hospital. This information is not intended to replace advice given to you by your health care provider. Make sure you discuss any questions you have with your health care provider. Document Revised: 07/23/2021 Document Reviewed: 07/23/2021 Elsevier Patient Education  2024 Else[employer] Inc. Food Choices to Help Relieve Diarrhea, Adult Diarrhea can make you feel weak and cause you to become dehydrated. Dehydration is a condition in which there is not enough water or other fluids in the body. It is important to choose the right foods and drinks to: Relieve diarrhea. Replace lost fluids and nutrients. Prevent dehydration. What are tips for following this plan? Relieving diarrhea Avoid foods that make your diarrhea worse. These may include: Foods and drinks that are sweetened with high-fructose corn syrup, honey, or sweeteners such as xylitol, sorbitol, and mannitol. Check food labels for these ingredients. Fried, greasy, or spicy foods. Raw fruits and vegetables. Eat foods that are rich in probiotics. These include foods such as yogurt and fermented milk products. Probiotics can help increase healthy bacteria in your stomach and intestines (gastrointestinal or GI tract). This may help digestion and stop diarrhea. If you have  lactose intolerance, avoid dairy products. These may make your diarrhea worse. Take medicine to help stop diarrhea only as told by your health care provider. Replacing nutrients  Eat bland, easy-to-digest foods in small amounts as you are able, until your diarrhea starts to get better. These foods include bananas, applesauce, rice, toast, and crackers. Over time, add nutrient-rich foods as your body tolerates them or as told by your health care provider. These include: Well-cooked protein foods, such as eggs, lean meats like fish or chicken without skin, and tofu. Peeled, seeded, and soft-cooked fruits and vegetables. Low-fat dairy products. Whole grains. Take vitamin and mineral supplements as told by your health care provider. Preventing dehydration  Start by sipping water or a solution to prevent dehydration (oral rehydration solution, or ORS). This is a drink that helps replace fluids and minerals your body has lost. You can buy an ORS at pharmacies and retail stores. Try to drink at least 8-10 cups (2,000-2,500 mL) of fluid each day to help replace lost fluids. If your urine is pale yellow, you are getting enough fluids. You may drink other liquids in addition to water, such as fruit juice that you have added water to (diluted fruit juice) or low-calorie sports drinks, as tolerated or as told by your health care provider. Avoid drinks with caffeine, such as coffee, tea, or soft drinks. Avoid alcohol. This information is not intended to replace advice given to you by your health care provider. Make sure you discuss any questions you have with your health care provider. Document Revised: 07/23/2021 Document Reviewed: 07/23/2021 Elsevier Patient Education  2024 Else[employer] Inc. Nausea, Adult Nausea is feeling like you may vomit. Feeling like you may vomit is usually not serious, but it may be an early sign of a more serious medical problem. Vomiting is when stomach contents forcefully come out  of your mouth. If you vomit, or if you are not able to drink enough fluids, you may not have enough water in your body (get dehydrated). If you do not have enough water in your body, you may: Feel tired. Feel thirsty. Have a dry mouth. Have cracked lips. Pee (urinate) less often. Older adults and people who have other diseases or a weak body defense system (immune system) have a higher risk of not having enough water in the body. The main goals of treating this condition are: To relieve your nausea. To ensure your nausea occurs less often. To prevent vomiting and losing too much fluid. Follow these instructions at home: Watch your symptoms for any changes. Tell your doctor about them. Eating and drinking     Take an ORS (oral rehydration solution). This is a drink that is sold at pharmacies and stores. Drink clear fluids in small amounts as you are able. These include: Water. Ice chips. Fruit juice that has water added (diluted fruit juice). Low-calorie sports drinks. Eat bland, easy-to-digest foods in small amounts as you are able, such as: Bananas. Applesauce. Rice. Low-fat (lean) meats. Toast. Crackers. Avoid drinking fluids that have a lot of sugar or caffeine in them. This includes energy drinks, sports drinks, and soda. Avoid alcohol. Avoid spicy or fatty foods. General instructions Take over-the-counter and prescription medicines only as told by your doctor. Rest at home while you get better. Drink enough fluid to keep your pee (urine) pale yellow. Take slow and deep breaths when you feel like you may vomit. Avoid food or things that have strong smells. Wash your hands often with soap and water  for at least 20 seconds. If you cannot use soap and water, use hand sanitizer. Make sure that everyone in your home washes their hands well and often. Keep all follow-up visits. Contact a doctor if: You feel worse. You feel like you may vomit and this lasts for more than 2  days. You vomit. You are not able to drink fluids without vomiting. You have new symptoms. You have a fever. You have a headache. You have muscle cramps. You have a rash. You have pain while peeing. You feel light-headed or dizzy. Get help right away if: You have pain in your chest, neck, arm, or jaw. You feel very weak or you faint. You have vomit that is bright red or looks like coffee grounds. You have bloody or black poop (stools) or poop that looks like tar. You have a very bad headache, a stiff neck, or both. You have very bad pain, cramping, or bloating in your belly (abdomen). You have trouble breathing or you are breathing very quickly. Your heart is beating very quickly. Your skin feels cold and clammy. You feel confused. You have signs of losing too much water in your body, such as: Dark pee, very little pee, or no pee. Cracked lips. Dry mouth. Sunken eyes. Sleepiness. Weakness. These symptoms may be an emergency. Get help right away. Call 911. Do not wait to see if the symptoms will go away. Do not drive yourself to the hospital. Summary Nausea is feeling like you are about vomit. If you vomit, or if you are not able to drink enough fluids, you may not have enough water in your body (get dehydrated). Eat and drink what your doctor tells you. Take over-the-counter and prescription medicines only as told by your doctor. Contact a doctor right away if your symptoms get worse or you have new symptoms. Keep all follow-up visits. This information is not intended to replace advice given to you by your health care provider. Make sure you discuss any questions you have with your health care provider. Document Revised: 08/10/2020 Document Reviewed: 08/10/2020 Elsevier Patient Education  2024 Else[employer] Inc. Inflammation of the Stomach in Adults (Gastritis): What to Know Gastritis is when the lining of your stomach gets red and swollen (inflammation). There're two kinds of  gastritis. There's gastritis that happens quickly. This is called acute gastritis. There's gastritis that happens over a long time. This is called chronic gastritis. Gastritis must be treated. If not, you can have sores or bleeding in your stomach. What are the causes? Germs that get to your stomach and cause an infection. Drinking too much alcohol. Taking some medicines. Too much acid in the stomach. Disease of the stomach. Allergies. Cancer treatments (radiation). Smoking cigarettes or using products that contain nicotine or tobacco. What increases the risk? Having a disease of the intestines. Having an autoimmune disease, such as Crohn's disease. This is when your immune system attacks other organs in the body. Using aspirin, ibuprofen, and other NSAIDs to treat other conditions. Stress. What are the signs or symptoms? Pain or burning in your belly. Feeling like you may throw up. Throwing up. Feeling too full after you eat. Losing weight. Bad breath. Blood in your throw-up or poop. In some cases, there are no symptoms. How is this treated? Gastritis is treated with medicines. The medicines that are used depend on what caused the condition. If the condition was caused by germs (bacteria), you'll be given antibiotics. If the condition was caused by too much acid in  the stomach, you'll be given antacids, H2 blockers, or proton pump inhibitors. You may also be told to stop taking certain medicines, such as aspirin, ibuprofen, and other NSAIDs. Follow these instructions at home: Medicines Take your medicines only as told. Take your antibiotics as told. Do not stop taking them even if you start to feel better. Alcohol use Do not drink alcohol if: Your doctor tells you not to drink. You are pregnant, may be pregnant, or are planning to become pregnant. If you drink alcohol: Limit how much you have to: 0-1 drink a day if you're female. 0-2 drinks a day if you're female. Know how  much alcohol is in your drink. In the [place] one drink is one 12 oz bottle of beer (355 mL), one 5 oz glass of wine (148 mL), or one 1 oz glass of hard liquor (44 mL). General instructions Eat small meals often, instead of large meals. Avoid foods and drinks that make your symptoms worse. Drink more fluid as told. Do not smoke, vape, or use nicotine or tobacco. Talk with your doctor about ways to manage stress. You may be told to: Get regular exercise. Do deep breathing. Do meditation or yoga. Contact a doctor if: Your symptoms get worse. The pain in your belly gets worse. Your symptoms go away and then come back. You have a fever. Get help right away if: You throw up blood or a substance that looks like coffee grounds. You have black or dark red poop. You throw up every time you drink something. These symptoms may be an emergency. Call 911 right away. Do not wait to see if the symptoms will go away. Do not drive yourself to the hospital. This information is not intended to replace advice given to you by your health care provider. Make sure you discuss any questions you have with your health care provider. Document Revised: 04/07/2023 Document Reviewed: 04/07/2023 Elsevier Patient Education  2025 [employer].  I provided 12 minutes of time during this video encounter.   Airik Goodlin [place] Thompson, DNP [facility] [address] [place], [place] [address] [phone] 01/07/2024

## 2024-03-03 ENCOUNTER — Ambulatory Visit: Payer: Self-pay | Admitting: Nurse Practitioner

## 2024-03-09 ENCOUNTER — Telehealth: Payer: Self-pay | Admitting: Nurse Practitioner

## 2024-03-09 ENCOUNTER — Ambulatory Visit: Payer: Self-pay | Admitting: Nurse Practitioner

## 2024-03-09 ENCOUNTER — Encounter: Payer: Self-pay | Admitting: Nurse Practitioner

## 2024-03-09 VITALS — BP 137/82 | HR 102 | Temp 98.6°F | Ht 65.0 in | Wt 236.8 lb

## 2024-03-09 DIAGNOSIS — N921 Excessive and frequent menstruation with irregular cycle: Secondary | ICD-10-CM

## 2024-03-09 DIAGNOSIS — F419 Anxiety disorder, unspecified: Secondary | ICD-10-CM

## 2024-03-09 DIAGNOSIS — L680 Hirsutism: Secondary | ICD-10-CM

## 2024-03-09 DIAGNOSIS — F32A Depression, unspecified: Secondary | ICD-10-CM

## 2024-03-09 DIAGNOSIS — F330 Major depressive disorder, recurrent, mild: Secondary | ICD-10-CM | POA: Diagnosis not present

## 2024-03-09 DIAGNOSIS — E119 Type 2 diabetes mellitus without complications: Secondary | ICD-10-CM | POA: Diagnosis not present

## 2024-03-09 DIAGNOSIS — F331 Major depressive disorder, recurrent, moderate: Secondary | ICD-10-CM

## 2024-03-09 DIAGNOSIS — E66812 Obesity, class 2: Secondary | ICD-10-CM

## 2024-03-09 DIAGNOSIS — Z7985 Long-term (current) use of injectable non-insulin antidiabetic drugs: Secondary | ICD-10-CM

## 2024-03-09 DIAGNOSIS — Z6839 Body mass index (BMI) 39.0-39.9, adult: Secondary | ICD-10-CM | POA: Diagnosis not present

## 2024-03-09 DIAGNOSIS — K760 Fatty (change of) liver, not elsewhere classified: Secondary | ICD-10-CM

## 2024-03-09 DIAGNOSIS — Z6837 Body mass index (BMI) 37.0-37.9, adult: Secondary | ICD-10-CM

## 2024-03-09 LAB — BAYER DCA HB A1C WAIVED: HB A1C (BAYER DCA - WAIVED): 5.9 % — ABNORMAL HIGH (ref 4.8–5.6)

## 2024-03-09 LAB — LIPID PANEL

## 2024-03-09 MED ORDER — SPIRONOLACTONE 50 MG PO TABS: 50.0000 mg | ORAL_TABLET | Freq: Every day | ORAL | 0 refills | Status: DC

## 2024-03-09 MED ORDER — SERTRALINE HCL 25 MG PO TABS: 25.0000 mg | ORAL_TABLET | Freq: Every day | ORAL | 0 refills | Status: DC

## 2024-03-09 MED ORDER — METFORMIN HCL 1000 MG PO TABS: 1000.0000 mg | ORAL_TABLET | Freq: Two times a day (BID) | ORAL | 3 refills | Status: DC

## 2024-03-09 MED ORDER — SEMAGLUTIDE (1 MG/DOSE) 4 MG/3ML ~~LOC~~ SOPN: 1.0000 mg | PEN_INJECTOR | SUBCUTANEOUS | 0 refills | Status: DC

## 2024-03-09 NOTE — Progress Notes (Signed)
 "    Subjective:  Patient ID: Denise Marsh, female    DOB: November 08, 1985, 39 y.o.   MRN: [id number]  Patient Care Team: Deitra Morton Sebastian Nena, NP as PCP - General (Nurse Practitioner)   Chief Complaint:  Medical Management of Chronic Issues   HPI: Denise Marsh is a 39 y.o. female presenting on 03/09/2024 for Medical Management of Chronic Issues   Discussed the use of AI scribe software for clinical note transcription with the patient, who gave verbal consent to proceed.  History of Present Illness Denise Marsh is a 39 year old female who presents for a three-month follow-up for chronic disease management.  She has experienced weight gain after discontinuing Ozempic  due to affordability issues, with the last dose taken at the previous visit. She is currently on metformin  500 mg twice daily, Zoloft  25 mg daily, and takes Imodium  as needed. She has not been taking Ozempic  due to a $190 copay and chaotic life circumstances, including a job change. Last A1c from 12/02/2023 4.9, A1c today 5.9%.  Her menstrual cycle is irregular, occurring every couple of months, with the most recent cycle lasting seven days. The first two days were light, followed by three days of severe pain, and then two days of light flow. Her cycles typically last three days. She is currently taking Aldactone  25 mg daily.  She mentions experiencing back pain that has returned 'full force'. She continues to work on hard floors and heavy equipment, which exacerbates her back pain.  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 She expresses stress related to job changes and financial constraints affecting her medication adherence.      Relevant past medical, surgical, family, and social history reviewed and updated as indicated.  Allergies and medications reviewed and updated. Data reviewed: Chart  in Epic.   Past Medical History:  Diagnosis Date   IBS (irritable bowel syndrome)    PCOS (polycystic ovarian syndrome)     Past Surgical History:  Procedure Laterality Date   APPENDECTOMY     FLEXIBLE SIGMOIDOSCOPY      Social History   Socioeconomic History   Marital status: Single    Spouse name: Not on file   Number of children: Not on file   Years of education: Not on file   Highest education level: Associate degree: academic program  Occupational History   Not on file  Tobacco Use   Smoking status: Never   Smokeless tobacco: Never  Vaping Use   Vaping status: Never Used  Substance and Sexual Activity   Alcohol use: Yes    Comment: occasionally   Drug use: No   Sexual activity: Yes    Birth control/protection: Condom  Other Topics Concern   Not on file  Social History Narrative   Not on file   Social Drivers of Health   Tobacco Use: Low Risk (03/09/2024)   Patient History    Smoking Tobacco Use: Never    Smokeless Tobacco Use: Never    Passive Exposure: Not on file  Financial Resource Strain: Low Risk (01/07/2024)   Overall Financial Resource Strain (CARDIA)    Difficulty of Paying Living Expenses: Not hard at all  Food Insecurity: No Food Insecurity (01/07/2024)   Epic    Worried About Radiation Protection Practitioner of Food in the Last Year: Never true    [employer] of Food in  the Last Year: Never true  Transportation Needs: No Transportation Needs (01/07/2024)   Epic    Lack of Transportation (Medical): No    Lack of Transportation (Non-Medical): No  Physical Activity: Insufficiently Active (01/07/2024)   Exercise Vital Sign    Days of Exercise per Week: 3 days    Minutes of Exercise per Session: 40 min  Stress: No Stress Concern Present (01/07/2024)   [employer] of Occupational Health - Occupational Stress Questionnaire    Feeling of Stress: Not at all  Social Connections: Moderately Integrated (01/07/2024)   Social Connection and Isolation Panel     Frequency of Communication with Friends and Family: More than three times a week    Frequency of Social Gatherings with Friends and Family: More than three times a week    Attends Religious Services: More than 4 times per year    Active Member of [employer] or Organizations: Yes    Attends Banker Meetings: More than 4 times per year    Marital Status: Never married  Intimate Partner Violence: Not At Risk (04/28/2023)   Humiliation, Afraid, Rape, and Kick questionnaire    Fear of Current or Ex-Partner: No    Emotionally Abused: No    Physically Abused: No    Sexually Abused: No  Depression (PHQ2-9): Low Risk (03/09/2024)   Depression (PHQ2-9)    PHQ-2 Score: 0  Alcohol Screen: Low Risk (04/28/2023)   Alcohol Screen    Last Alcohol Screening Score (AUDIT): 1  Housing: Low Risk (01/07/2024)   Epic    Unable to Pay for Housing in the Last Year: No    Number of Times Moved in the Last Year: 0    Homeless in the Last Year: No  Utilities: Not At Risk (04/28/2023)   [facility] Utilities    Threatened with loss of utilities: No  Health Literacy: Not on file    Outpatient Encounter Medications as of 03/09/2024  Medication Sig   metFORMIN  (GLUCOPHAGE ) 1000 MG tablet Take 1 tablet (1,000 mg total) by mouth 2 (two) times daily with a meal.   spironolactone  (ALDACTONE ) 50 MG tablet Take 1 tablet (50 mg total) by mouth daily.   loperamide  (IMODIUM  A-D) 2 MG tablet Take 1 tablet (2 mg total) by mouth 4 (four) times daily as needed for diarrhea or loose stools.   ondansetron  (ZOFRAN ) 4 MG tablet Take 1 tablet (4 mg total) by mouth every 8 (eight) hours as needed for nausea or vomiting.   Semaglutide , 1 MG/DOSE, 4 MG/3ML SOPN Inject 1 mg as directed once a week.   sertraline  (ZOLOFT ) 25 MG tablet Take 1 tablet (25 mg total) by mouth daily.   [DISCONTINUED] metFORMIN  (GLUCOPHAGE ) 500 MG tablet Take 1 tablet (500 mg total) by mouth 2 (two) times daily with a meal.   [DISCONTINUED] Semaglutide , 1  MG/DOSE, 4 MG/3ML SOPN Inject 1 mg as directed once a week.   [DISCONTINUED] sertraline  (ZOLOFT ) 25 MG tablet Take 1 tablet (25 mg total) by mouth daily.   [DISCONTINUED] spironolactone  (ALDACTONE ) 25 MG tablet Take 1 tablet (25 mg total) by mouth daily.   No facility-administered encounter medications on file as of 03/09/2024.    Allergies[1]  Pertinent ROS per HPI, otherwise unremarkable      Objective:  BP 137/82   Pulse (!) 102   Temp 98.6 F (37 C)   Ht 5' 5 (1.651 m)   Wt 236 lb 12.8 oz (107.4 kg)   SpO2 99%   BMI 39.41  kg/m    Wt Readings from Last 3 Encounters:  03/09/24 236 lb 12.8 oz (107.4 kg)  12/02/23 225 lb 3.2 oz (102.2 kg)  09/22/23 228 lb 3.2 oz (103.5 kg)    Physical Exam Vitals and nursing note reviewed.  Constitutional:      Appearance: She is obese.  HENT:     Head: Normocephalic and atraumatic.     Nose: Nose normal.     Mouth/Throat:     Mouth: Mucous membranes are moist.  Eyes:     General: No scleral icterus.    Extraocular Movements: Extraocular movements intact.     Conjunctiva/sclera: Conjunctivae normal.     Pupils: Pupils are equal, round, and reactive to light.  Cardiovascular:     Heart sounds: Normal heart sounds.  Pulmonary:     Effort: Pulmonary effort is normal.     Breath sounds: Normal breath sounds.  Abdominal:     General: Bowel sounds are normal.     Palpations: Abdomen is soft.  Musculoskeletal:        General: Normal range of motion.     Right lower leg: No edema.     Left lower leg: No edema.  Skin:    General: Skin is warm and dry.     Findings: No rash.  Neurological:     Mental Status: She is oriented to person, place, and time.  Psychiatric:        Mood and Affect: Mood normal.        Behavior: Behavior normal.        Thought Content: Thought content normal.        Judgment: Judgment normal.    Physical Exam      Results for orders placed or performed in visit on 12/02/23  Bayer DCA Hb A1c Waived    Collection Time: 12/02/23  8:15 AM  Result Value Ref Range   HB A1C (BAYER DCA - WAIVED) 4.9 4.8 - 5.6 %       Pertinent labs & imaging results that were available during my care of the patient were reviewed by me and considered in my medical decision making.  Assessment & Plan:  Denise Marsh was seen today for medical management of chronic issues.  Diagnoses and all orders for this visit:  Hirsutism -     spironolactone  (ALDACTONE ) 50 MG tablet; Take 1 tablet (50 mg total) by mouth daily.  Mild episode of recurrent major depressive disorder -     sertraline  (ZOLOFT ) 25 MG tablet; Take 1 tablet (25 mg total) by mouth daily.  Type 2 diabetes mellitus without complication, without long-term current use of insulin (HCC) -     Bayer DCA Hb A1c Waived -     CBC with Differential/Platelet -     Lipid panel -     CMP14+EGFR -     Vitamin B12 -     Microalbumin / creatinine urine ratio -     metFORMIN  (GLUCOPHAGE ) 1000 MG tablet; Take 1 tablet (1,000 mg total) by mouth 2 (two) times daily with a meal. -     Semaglutide , 1 MG/DOSE, 4 MG/3ML SOPN; Inject 1 mg as directed once a week.  Class 2 severe obesity due to excess calories with serious comorbidity and body mass index (BMI) of 39.0 to 39.9 in adult -     Semaglutide , 1 MG/DOSE, 4 MG/3ML SOPN; Inject 1 mg as directed once a week.  Long-term (current) use of injectable non-insulin antidiabetic drugs  Class  2 obesity without serious comorbidity with body mass index (BMI) of 37.0 to 37.9 in adult, unspecified obesity type -     Thyroid  Panel With TSH -     metFORMIN  (GLUCOPHAGE ) 1000 MG tablet; Take 1 tablet (1,000 mg total) by mouth 2 (two) times daily with a meal. -     Semaglutide , 1 MG/DOSE, 4 MG/3ML SOPN; Inject 1 mg as directed once a week.  Menorrhagia with irregular cycle  Fatty liver  Moderate episode of recurrent major depressive disorder (HCC) -     sertraline  (ZOLOFT ) 25 MG tablet; Take 1 tablet (25 mg total) by mouth  daily.  Anxiety and depression -     sertraline  (ZOLOFT ) 25 MG tablet; Take 1 tablet (25 mg total) by mouth daily.     Assessment and Plan Denise Marsh is a 39 yrs old [place] female seen today for chronic diseases management no acute distress Assessment & Plan Type 2 diabetes mellitus Suboptimal glycemic control due to non-adherence to semaglutide  caused by cost and scheduling issues. - Increased metformin  to 1000 mg twice daily. - Restarted semaglutide  (Ozempic ) 1 mg weekly. - Advised to seek a coupon for semaglutide  to reduce cost. - micro albumin ordered results pending  Class 2 obesity Weight gain due to non-adherence to semaglutide . - Restart semaglutide  (Ozempic ) 1 mg weekly. - Increased metformin  to 1000 mg twice daily.  Hirsutism Managed with spironolactone , no significant improvement at current dose. - Increased spironolactone  to 50 mg daily.  Menorrhagia with irregular cycle Irregular cycles with prolonged bleeding and pain during the last three days. - Continue keeping a diary of menstrual cycles. - Increased spironolactone  to 50 mg daily.  Low back pain Recurrent pain exacerbated by prolonged standing and walking on hard surfaces. - Recommended physical therapy as required by insurance for further interventions. - Advised wearing good shoes and using a mat at work to reduce strain on the back.  Labs  CBC, CMP, lipids, TSH, A1c, vitamin D result pending  Continue all other maintenance medications.  Follow up plan: No follow-ups on file.   Continue healthy lifestyle choices, including diet (rich in fruits, vegetables, and lean proteins, and low in salt and simple carbohydrates) and exercise (at least 30 minutes of moderate physical activity daily).  Educational handout given for   Clinical References  Managing Depression, Adult Depression is a mental health condition that affects your thoughts, feelings, and actions. Being diagnosed with depression  can bring you relief if you did not know why you have felt or behaved a certain way. It could also leave you feeling overwhelmed. Finding ways to manage your symptoms can help you feel more positive about your future. How to manage lifestyle changes Being depressed is difficult. Depression can increase the level of everyday stress. Stress can make depression symptoms worse. You may believe your symptoms cannot be managed or will never improve. However, there are many things you can try to help manage your symptoms. There is hope. Managing stress  Stress is your body's reaction to life changes and events, both good and bad. Stress can add to your feelings of depression. Learning to manage your stress can help lessen your feelings of depression. Try some of the following approaches to reducing your stress (stress reduction techniques): Listen to music that you enjoy and that inspires you. Try using a meditation app or take a meditation class. Develop a practice that helps you connect with your spiritual self. Walk in nature, pray, or go to a place  of worship. Practice deep breathing. To do this, inhale slowly through your nose. Pause at the top of your inhale for a few seconds and then exhale slowly, letting yourself relax. Repeat this three or four times. Practice yoga to help relax and work your muscles. Choose a stress reduction technique that works for you. These techniques take time and practice to develop. Set aside 5-15 minutes a day to do them. Therapists can offer training in these techniques. Do these things to help manage stress: Keep a journal. Know your limits. Set healthy boundaries for yourself and others, such as saying no when you think something is too much. Pay attention to how you react to certain situations. You may not be able to control everything, but you can change your reaction. Add humor to your life by watching funny movies or shows. Make time for activities that you  enjoy and that relax you. Spend less time using electronics, especially at night before bed. The light from screens can make your brain think it is time to get up rather than go to bed.   Medicines Medicines, such as antidepressants, are often a part of treatment for depression. Talk with your pharmacist or health care provider about all the medicines, supplements, and herbal products that you take, their possible side effects, and what medicines and other products are safe to take together. Make sure to report any side effects you may have to your health care provider. Relationships Your health care provider may suggest family therapy, couples therapy, or individual therapy as part of your treatment. How to recognize changes Everyone responds differently to treatment for depression. As you recover from depression, you may start to: Have more interest in doing activities. Feel more hopeful. Have more energy. Eat a more regular amount of food. Have better mental focus. It is important to recognize if your depression is not getting better or is getting worse. The symptoms you had in the beginning may return, such as: Feeling tired. Eating too much or too little. Sleeping too much or too little. Feeling restless, agitated, or hopeless. Trouble focusing or making decisions. Having unexplained aches and pains. Feeling irritable, angry, or aggressive. If you or your family members notice these symptoms coming back, let your health care provider know right away. Follow these instructions at home: Activity Try to get some form of exercise each day, such as walking. Try yoga, mindfulness, or other stress reduction techniques. Participate in group activities if you are able. Lifestyle Get enough sleep. Cut down on or stop using caffeine, tobacco, alcohol, and any other harmful substances. Eat a healthy diet that includes plenty of vegetables, fruits, whole grains, low-fat dairy products, and  lean protein. Limit foods that are high in solid fats, added sugar, or salt (sodium). General instructions Take over-the-counter and prescription medicines only as told by your health care provider. Keep all follow-up visits. It is important for your health care provider to check on your mood, behavior, and medicines. Your health care provider may need to make changes to your treatment. Where to find support Talking to others  Friends and family members can be sources of support and guidance. Talk to trusted friends or family members about your condition. Explain your symptoms and let them know that you are working with a health care provider to treat your depression. Tell friends and family how they can help. Finances Find mental health providers that fit with your financial situation. Talk with your health care provider if you are  worried about access to food, housing, or medicine. Call your insurance company to learn about your co-pays and prescription plan. Where to find more information You can find support in your area from: Anxiety and Depression Association of America (ADAA): adaa.org Mental Health America: mentalhealthamerica.net [employer] on Mental Illness: nami.org Contact a health care provider if: You stop taking your antidepressant medicines, and you have any of these symptoms: Nausea. Headache. Light-headedness. Chills and body aches. Not being able to sleep (insomnia). You or your friends and family think your depression is getting worse. Get help right away if: You have thoughts of hurting yourself or others. Get help right away if you feel like you may hurt yourself or others, or have thoughts about taking your own life. Go to your nearest emergency room or: Call 911. Call the National Suicide Prevention Lifeline at 803-859-7365 or 988. This is open 24 hours a day. Text the Crisis Text Line at 3077972100. This information is not intended to replace advice given  to you by your health care provider. Make sure you discuss any questions you have with your health care provider. Document Revised: 06/11/2021 Document Reviewed: 06/11/2021 Elsevier Patient Education  2024 Elsevier Inc. BMI for Adults Body mass index (BMI) is a number found using a person's weight and height. BMI can help tell how much of a person's weight is made up of fat. BMI does not measure body fat directly. It is used instead of tests that directly measure body fat, which can be difficult and expensive. What are BMI measurements used for? BMI is useful to: Find out if your weight puts you at higher risk for medical problems. Help recommend changes, such as in diet and exercise. This can help you reach a healthy weight. BMI screening can be done again to see if these changes are working. How is BMI calculated? Your height and weight are measured. The BMI is found from those numbers. This can be done with U[place] or metric measurements. Note that charts and online BMI calculators are available to help you find your BMI quickly and easily without doing these calculations. To calculate your BMI in [place] measurements: Measure your weight in pounds (lb). Multiply the number of pounds by 703. So, for an adult who weighs 150 lb, multiply that number by 703: 150 x 703, which equals 105,450. Measure your height in inches. Then multiply that number by itself to get a measurement called inches squared. So, for an adult who is 70 inches tall, the inches squared measurement is 70 inches x 70 inches, which equals 4,900 inches squared. Divide the total from step 2 (number of lb x 703) by the total from step 3 (inches squared): 105,450  4,900 = 21.5. This is your BMI. To calculate your BMI in metric measurements:  Measure your weight in kilograms (kg). For this example, the weight is 70 kg. Measure your height in meters (m). Then multiply that number by itself to get a measurement called meters  squared. So, for an adult who is 1.75 m tall, the meters squared measurement is 1.75 m x 1.75 m, which equals 3.1 meters squared. Divide the number of kilograms (your weight) by the meters squared number. In this example: 70  3.1 = 22.6. This is your BMI. What do the results mean? BMI charts are used to see if you are underweight, normal weight, overweight, or obese. The following guidelines will be used: Underweight: BMI less than 18.5. Normal weight: BMI between 18.5 and 24.9.  Overweight: BMI between 25 and 29.9. Obese: BMI of 30 or above. BMI is a tool and cannot diagnose a condition. Talk with your health care provider about what your BMI means for you. Keep these notes in mind: Weight includes fat and muscle. Someone with a muscular build, such as an athlete, may have a BMI that is higher than 24.9. In cases like these, BMI is not a correct measure of body fat. If you have a BMI of 25 or higher, your provider may need to do more testing to find out if excess body fat is the cause. BMI is measured the same way for males and females. Females usually have more body fat than males of the same height and weight. Where to find more information For more information about BMI, including tools to quickly find your BMI, go to: Centers for Disease Control and Prevention: tonerpromos.no American Heart Association: heart.org National Heart, Lung, and Blood Institute: buffalodrycleaner.gl This information is not intended to replace advice given to you by your health care provider. Make sure you discuss any questions you have with your health care provider. Document Revised: 10/24/2021 Document Reviewed: 10/17/2021 Elsevier Patient Education  2024 Elsevier Inc. Obesity, Adult Obesity is the condition of having too much total body fat. Being overweight or obese means that your weight is greater than what is considered healthy for your body size. Obesity is determined by a measurement called BMI (body mass index).  BMI is an estimate of body fat and is calculated from height and weight. For adults, a BMI of 30 or higher is considered obese. Obesity can lead to other health concerns and major illnesses, including: Stroke. Coronary artery disease (CAD). Type 2 diabetes. Some types of cancer, including cancers of the colon, breast, uterus, and gallbladder. High blood pressure (hypertension). High cholesterol. Gallbladder stones. Obesity can also contribute to: Osteoarthritis. Sleep apnea. Infertility problems. What are the causes? Common causes of this condition include: Eating daily meals that are high in calories, sugar, and fat. Drinking high amounts of sugar-sweetened beverages, such as soft drinks. Being born with genes that may make you more likely to become obese. Having a medical condition that causes obesity, including: Hypothyroidism. Polycystic ovarian syndrome (PCOS). Binge-eating disorder. Cushing syndrome. Taking certain medicines, such as steroids, antidepressants, and seizure medicines. Not being physically active (sedentary lifestyle). Not getting enough sleep. What increases the risk? The following factors may make you more likely to develop this condition: Having a family history of obesity. Living in an area with limited access to: [place], recreation centers, or sidewalks. Healthy food choices, such as grocery stores and farmers' markets. What are the signs or symptoms? The main sign of this condition is having too much body fat. How is this diagnosed? This condition is diagnosed based on: Your BMI. If you are an adult with a BMI of 30 or higher, you are considered obese. Your waist circumference. This measures the distance around your waistline. Your skinfold thickness. Your health care provider may gently pinch a fold of your skin and measure it. You may have other tests to check for underlying conditions. How is this treated? Treatment for this condition often  includes changing your lifestyle. Treatment may include some or all of the following: Dietary changes. This may include developing a healthy meal plan. Regular physical activity. This may include activity that causes your heart to beat faster (aerobic exercise) and strength training. Work with your health care provider to design an exercise program that works for  you. Medicine to help you lose weight if you are unable to lose one pound a week after six weeks of healthy eating and more physical activity. Treating conditions that cause the obesity (underlying conditions). Surgery. Surgical options may include gastric banding and gastric bypass. Surgery may be done if: Other treatments have not helped to improve your condition. You have a BMI of 40 or higher. You have life-threatening health problems related to obesity. Follow these instructions at home: Eating and drinking  Follow recommendations from your health care provider about what you eat and drink. Your health care provider may advise you to: Limit fast food, sweets, and processed snack foods. Choose low-fat options, such as low-fat milk instead of whole milk. Eat five or more servings of fruits or vegetables every day. Choose healthy foods when you eat out. Keep low-fat snacks available. Limit sugary drinks, such as soda, fruit juice, sweetened iced tea, and flavored milk. Drink enough water to keep your urine pale yellow. Do not follow a fad diet. Fad diets can be unhealthy and even dangerous. Other healthful choices include: Eat at home more often. This gives you more control over what you eat. Learn to read food labels. This will help you understand how much food is considered one serving. Learn what a healthy serving size is. Physical activity Exercise regularly, as told by your health care provider. Most adults should get up to 150 minutes of moderate-intensity exercise every week. Ask your health care provider what types of  exercise are safe for you and how often you should exercise. Warm up and stretch before being active. Cool down and stretch after being active. Rest between periods of activity. Lifestyle Work with your health care provider and a dietitian to set a weight-loss goal that is healthy and reasonable for you. Limit your screen time. Find ways to reward yourself that do not involve food. Do not drink alcohol if: Your health care provider tells you not to drink. You are pregnant, may be pregnant, or are planning to become pregnant. If you drink alcohol: Limit how much you have to: 0-1 drink a day for women. 0-2 drinks a day for men. Know how much alcohol is in your drink. In the [place] one drink equals one 12 oz bottle of beer (355 mL), one 5 oz glass of wine (148 mL), or one 1 oz glass of hard liquor (44 mL). General instructions Keep a weight-loss journal to keep track of the food you eat and how much exercise you get. Take over-the-counter and prescription medicines only as told by your health care provider. Take vitamins and supplements only as told by your health care provider. Consider joining a support group. Your health care provider may be able to recommend a support group. Pay attention to your mental health as obesity can lead to depression or self esteem issues. Keep all follow-up visits. This is important. Contact a health care provider if: You are unable to meet your weight-loss goal after six weeks of dietary and lifestyle changes. You have trouble breathing. Summary Obesity is the condition of having too much total body fat. Being overweight or obese means that your weight is greater than what is considered healthy for your body size. Work with your health care provider and a dietitian to set a weight-loss goal that is healthy and reasonable for you. Exercise regularly, as told by your health care provider. Ask your health care provider what types of exercise are safe for you  and how  often you should exercise. This information is not intended to replace advice given to you by your health care provider. Make sure you discuss any questions you have with your health care provider. Document Revised: 09/11/2020 Document Reviewed: 09/11/2020 Elsevier Patient Education  2024 Elsevier Inc. Fatty Liver Disease (Steatotic Liver Disease): What to Know  Your liver is an organ with many jobs. It makes proteins and helps change food into energy. It also gets rid of harmful things in your blood and absorbs vitamins from food. Fatty liver disease happens when too much fat builds up in your liver cells. It's also called steatotic liver disease. In many cases, fatty liver disease doesn't cause symptoms. But over time, it can cause irritation and swelling. This can lead to other liver problems, such as: Cirrhosis, or scarring of the liver. Liver cancer. Liver failure. What are the causes? Fatty liver disease may be caused by: Being overweight. Having: High cholesterol. High blood pressure. Cushing syndrome. Not getting enough nutrients in your diet. Other causes include: Certain drugs. Poisons. Some infections caused by a germ called a virus. What increases the risk? You're more likely to get fatty liver disease if: You drink alcohol. You're overweight. You have diabetes. You have hepatitis. You have a high triglyceride level. You're pregnant. What are the signs or symptoms? You may not have symptoms. If you do, they may include: Feeling weak and tired. Losing weight. Feeling like you may throw up. Throwing up. Jaundice. This is when your skin or the white parts of your eyes turn yellow. Swelling in your belly or legs. Tenderness in the right-upper part of your belly. How is this diagnosed? Fatty liver disease may be diagnosed based on your medical history and an exam. You may also need tests. These may include: Blood tests. An ultrasound. A CT scan. An  MRI. A biopsy. This is when a small piece of tissue is removed from your liver for testing. How is this treated? Fatty liver disease is often caused by other conditions. You may need to take medicines and make changes to your daily life. These changes may help you manage conditions, such as: Alcohol use disorder. This is a condition where you may not be able to stop drinking. High cholesterol. Diabetes. Being overweight. Follow these instructions at home: Eat healthy. Work with your health care provider or an expert in healthy eating called a dietitian. They can help you make an eating plan. Get enough exercise. This can help you lose weight. It can also help you manage your cholesterol and diabetes. Talk to your provider about an exercise plan. Ask what things are best for you to do. Do not drink alcohol. If you have trouble quitting, ask your provider for help. Take your medicines only as told. Keep all follow-up visits. Your provider will check if you're getting better. Contact a health care provider if: You can't control your blood sugar. This is extra important if you have diabetes. You have a fever. You have swelling in your belly or legs. You have belly pain. You have jaundice. You feel like you may throw up. You throw up. Get help right away if: You throw up, and it looks like: Bright red blood. Coffee grounds. You throw up something that looks like coffee ground. Your poop looks bloody or black. You get confused. These symptoms may be an emergency. Call 911 right away. Do not wait to see if the symptoms will go away. Do not drive yourself to the hospital. This  information is not intended to replace advice given to you by your health care provider. Make sure you discuss any questions you have with your health care provider. Document Revised: 07/24/2022 Document Reviewed: 07/24/2022 Elsevier Patient Education  2024 [employer] Inc. Hirsutism and Masculinization Hirsutism is  when a female has an excessive amount of hair in an area where a female would typically have hair growth, such as on the face, chest, or back. Masculinization is when a female's body develops certain characteristics that are like a female's body. What are the causes? This condition may be caused by: Polycystic ovary syndrome (PCOS). This is the most common cause. Certain medicines, such as androgens and anabolic steroids. Cushing's syndrome. Tumors. Increased levels of androgen (testosterone). What increases the risk? The following factors may make you more likely to develop this condition: Family history. Obesity. What are the signs or symptoms? Hirsutism Symptoms of this condition include excess hair growth in areas where women typically do not grow hair, such as on the: Face. Chest. Thighs. Back. Masculinization Symptoms of this condition include: Deepening voice. Decreased breast size. Increased muscle mass. Thinning hair on the head (balding). Irregular or no menstrual periods. Enlarged clitoris. How is this diagnosed? These conditions may be diagnosed based on: Your medical history. A physical exam. Tests that may include: Blood tests. Imaging studies. Your health care provider may recommend that you follow up with specialists to understand the cause of your condition or to help treat it. How is this treated? This condition may be treated by: Taking medicines to help control hair growth. Making lifestyle changes to help reduce hormone levels that are contributing to your condition. Removing unwanted hair by shaving, using creams, or waxing. More permanent ways to remove hair include electrolysis and laser treatments. If your symptoms are caused by certain medicines, your health care provider may have you stop taking them. Follow these instructions at home: Take over-the-counter and prescription medicines only as told by your health care provider. Talk with your health  care provider about your treatment options and what may be best for you. Maintain a healthy weight. If needed, talk with your health care provider about how to lose weight. Keep all follow-up visits. This is important. Contact a health care provider if: Your symptoms suddenly get worse. You develop acne. You have irregular menstrual periods. You stop having your menstrual period. You feel a lump in your lower abdomen. Summary Hirsutism is when a female has an excessive amount of hair in an area where a female would typically have hair growth, such as on the face, chest, thighs, or back. Masculinization is when a female's body develops characteristics like a female's body. This may include a deepening voice, decreased breast size, increased muscle mass, thinning hair (balding), changes in menstrual periods, and clitoris growth. The most common cause of this condition is polycystic ovary syndrome (PCOS). Treatment options include removing unwanted hair, taking medicines, and making lifestyle changes. Talk with your health care provider about which treatments are best for you. Your health care provider may refer you to specialists to find the cause of your condition. This information is not intended to replace advice given to you by your health care provider. Make sure you discuss any questions you have with your health care provider. Document Revised: 12/22/2022 Document Reviewed: 12/22/2022 Elsevier Patient Education  2024 [employer] Inc. Daily Diabetes Record  Use this form to record your blood sugar, or blood glucose (BG), results as well as any diabetes medicines you  take, including insulin. Check your BG as told by your health care provider. A record of your BG results and a list of your current medicines are very helpful in managing your diabetes. Your BG numbers help your provider know whether your diabetes management plan needs to be changed. Patient name: ____________________________________  Week of ____________________ Daily BG results and diabetes medicines Date: _________ Breakfast - BG / Medicines: ________________ / __________________________________________________________ Lunch - BG / Medicines: ___________________ / __________________________________________________________ Dinner - BG / Medicines: __________________ / __________________________________________________________ Bedtime - BG / Medicines: ________________ / ___________________________________________________________ Date: _________ Breakfast - BG / Medicines: ________________ / __________________________________________________________ Lunch - BG / Medicines: ___________________ / __________________________________________________________ Dinner - BG / Medicines: __________________ / __________________________________________________________ Bedtime - BG / Medicines: ________________ / ___________________________________________________________ Date: _________ Breakfast - BG / Medicines: ________________ / __________________________________________________________ Lunch - BG / Medicines: ___________________ / __________________________________________________________ Dinner - BG / Medicines: __________________ / __________________________________________________________ Bedtime - BG / Medicines: ________________ / ___________________________________________________________ Date: _________ Breakfast - BG / Medicines: ________________ / __________________________________________________________ Lunch - BG / Medicines: ___________________ / __________________________________________________________ Dinner - BG / Medicines: __________________ / __________________________________________________________ Bedtime - BG / Medicines: ________________ / ___________________________________________________________ Date: _________ Breakfast - BG / Medicines: ________________ /  __________________________________________________________ Lunch - BG / Medicines: ___________________ / __________________________________________________________ Dinner - BG / Medicines: __________________ / __________________________________________________________ Bedtime - BG / Medicines: ________________ / ___________________________________________________________ Date: _________ Breakfast - BG / Medicines: ________________ / __________________________________________________________ Lunch - BG / Medicines: ___________________ / __________________________________________________________ Dinner - BG / Medicines: __________________ / __________________________________________________________ Bedtime - BG / Medicines: ________________ / ___________________________________________________________ Date: _________ Breakfast - BG / Medicines: ________________ / __________________________________________________________ Lunch - BG / Medicines: ___________________ / __________________________________________________________ Dinner - BG / Medicines: __________________ / __________________________________________________________ Bedtime - BG / Medicines: ________________ / ___________________________________________________________ Notes: ______________________________________________________________________________________________________________________ This information is not intended to replace advice given to you by your health care provider. Make sure you discuss any questions you have with your health care provider. Document Revised: 09/12/2022 Document Reviewed: 09/12/2022 Elsevier Patient Education  2024 [employer] Inc.  The above assessment and management plan was discussed with the patient. The patient verbalized understanding of and has agreed to the management plan. Patient is aware to call the clinic if they develop any new symptoms or if symptoms persist or worsen. Patient is aware when  to return to the clinic for a follow-up visit. Patient educated on when it is appropriate to go to the emergency department.   Nena Cassis Morton Hummer, DNP [facility] [address] [place], [place] [address] [phone]       [1]  Allergies Allergen Reactions   Amoxicillin    Benadryl [Diphenhydramine Hcl (Sleep)]    Nsaids    Penicillins Rash    Has patient had a PCN reaction causing immediate rash, facial/tongue/throat swelling, SOB or lightheadedness with hypotension: No Has patient had a PCN reaction causing severe rash involving mucus membranes or skin necrosis: No Has patient had a PCN reaction that required hospitalization No Has patient had a PCN reaction occurring within the last 10 years: Yes If all of the above answers are NO, then may proceed with Cephalosporin use.    "

## 2024-03-09 NOTE — Telephone Encounter (Signed)
 Okay to transfer Rx's to correct pharmacy?    Copied from CRM #[id number]. Topic: Clinical - Prescription Issue >> Mar 09, 2024  2:26 PM Denise Marsh wrote: Reason for CRM: Patient states that Denise Marsh. Denise Marsh sent some prescriptions to the wrong pharmacy. She's needing metFORMIN  (GLUCOPHAGE ) 1000 MG tablet, Semaglutide , 1 MG/DOSE, 4 MG/3ML SOPN, sertraline  (ZOLOFT ) 25 MG tablet, & spironolactone  (ALDACTONE ) 50 MG tablet to be sent to:   [id number] - [place], [place] - [address] [place] [address] Phone: [phone] Fax: [phone] Hours: Not open 24 hours

## 2024-03-10 LAB — CBC WITH DIFFERENTIAL/PLATELET
Basophils Absolute: 0.1 x10E3/uL (ref 0.0–0.2)
Basos: 1 %
EOS (ABSOLUTE): 0.1 x10E3/uL (ref 0.0–0.4)
Eos: 1 %
Hematocrit: 39.8 % (ref 34.0–46.6)
Hemoglobin: 13.1 g/dL (ref 11.1–15.9)
Immature Grans (Abs): 0.1 x10E3/uL (ref 0.0–0.1)
Immature Granulocytes: 1 %
Lymphocytes Absolute: 3.9 x10E3/uL — ABNORMAL HIGH (ref 0.7–3.1)
Lymphs: 39 %
MCH: 28 pg (ref 26.6–33.0)
MCHC: 32.9 g/dL (ref 31.5–35.7)
MCV: 85 fL (ref 79–97)
Monocytes Absolute: 1 x10E3/uL — ABNORMAL HIGH (ref 0.1–0.9)
Monocytes: 10 %
Neutrophils Absolute: 5 x10E3/uL (ref 1.4–7.0)
Neutrophils: 48 %
Platelets: 304 x10E3/uL (ref 150–450)
RBC: 4.68 x10E6/uL (ref 3.77–5.28)
RDW: 12.4 % (ref 11.7–15.4)
WBC: 10.2 x10E3/uL (ref 3.4–10.8)

## 2024-03-10 LAB — CMP14+EGFR
ALT: 27 IU/L (ref 0–32)
AST: 24 IU/L (ref 0–40)
Albumin: 4.5 g/dL (ref 3.9–4.9)
Alkaline Phosphatase: 82 IU/L (ref 41–116)
BUN/Creatinine Ratio: 16 (ref 9–23)
BUN: 11 mg/dL (ref 6–20)
Bilirubin Total: 0.3 mg/dL (ref 0.0–1.2)
CO2: 22 mmol/L (ref 20–29)
Calcium: 10.1 mg/dL (ref 8.7–10.2)
Chloride: 99 mmol/L (ref 96–106)
Creatinine, Ser: 0.7 mg/dL (ref 0.57–1.00)
Globulin, Total: 3 g/dL (ref 1.5–4.5)
Glucose: 68 mg/dL — AB (ref 70–99)
Potassium: 3.9 mmol/L (ref 3.5–5.2)
Sodium: 137 mmol/L (ref 134–144)
Total Protein: 7.5 g/dL (ref 6.0–8.5)
eGFR: 113 mL/min/1.73

## 2024-03-10 LAB — LIPID PANEL
Cholesterol, Total: 192 mg/dL (ref 100–199)
HDL: 44 mg/dL
LDL CALC COMMENT:: 4.4 ratio (ref 0.0–4.4)
LDL Chol Calc (NIH): 98 mg/dL (ref 0–99)
Triglycerides: 300 mg/dL — AB (ref 0–149)
VLDL Cholesterol Cal: 50 mg/dL — AB (ref 5–40)

## 2024-03-10 LAB — MICROALBUMIN / CREATININE URINE RATIO
Creatinine, Urine: 114.3 mg/dL
Microalb/Creat Ratio: 5 mg/g{creat} (ref 0–29)
Microalbumin, Urine: 5.8 ug/mL

## 2024-03-10 LAB — THYROID PANEL WITH TSH
Free Thyroxine Index: 1.4 (ref 1.2–4.9)
T3 Uptake Ratio: 26 % (ref 24–39)
T4, Total: 5.4 ug/dL (ref 4.5–12.0)
TSH: 3.16 u[IU]/mL (ref 0.450–4.500)

## 2024-03-10 LAB — VITAMIN B12: Vitamin B-12: 374 pg/mL (ref 232–1245)

## 2024-03-11 ENCOUNTER — Other Ambulatory Visit: Payer: Self-pay

## 2024-03-11 ENCOUNTER — Ambulatory Visit: Payer: Self-pay | Admitting: Nurse Practitioner

## 2024-03-11 DIAGNOSIS — E119 Type 2 diabetes mellitus without complications: Secondary | ICD-10-CM

## 2024-03-11 DIAGNOSIS — L680 Hirsutism: Secondary | ICD-10-CM

## 2024-03-11 DIAGNOSIS — F331 Major depressive disorder, recurrent, moderate: Secondary | ICD-10-CM

## 2024-03-11 DIAGNOSIS — E66812 Obesity, class 2: Secondary | ICD-10-CM

## 2024-03-11 DIAGNOSIS — E781 Pure hyperglyceridemia: Secondary | ICD-10-CM

## 2024-03-11 DIAGNOSIS — Z6837 Body mass index (BMI) 37.0-37.9, adult: Secondary | ICD-10-CM

## 2024-03-11 DIAGNOSIS — F330 Major depressive disorder, recurrent, mild: Secondary | ICD-10-CM

## 2024-03-11 DIAGNOSIS — F32A Depression, unspecified: Secondary | ICD-10-CM

## 2024-03-11 MED ORDER — METFORMIN HCL 1000 MG PO TABS: 1000.0000 mg | ORAL_TABLET | Freq: Two times a day (BID) | ORAL | 3 refills | Status: AC

## 2024-03-11 MED ORDER — FENOFIBRATE 145 MG PO TABS: 145.0000 mg | ORAL_TABLET | Freq: Every day | ORAL | 5 refills | Status: DC

## 2024-03-11 MED ORDER — SEMAGLUTIDE (1 MG/DOSE) 4 MG/3ML ~~LOC~~ SOPN: 1.0000 mg | PEN_INJECTOR | SUBCUTANEOUS | 0 refills | Status: AC

## 2024-03-11 MED ORDER — SPIRONOLACTONE 50 MG PO TABS: 50.0000 mg | ORAL_TABLET | Freq: Every day | ORAL | 0 refills | Status: AC

## 2024-03-11 MED ORDER — OMEGA-3-ACID ETHYL ESTERS 1 G PO CAPS: 1.0000 g | ORAL_CAPSULE | Freq: Two times a day (BID) | ORAL | 0 refills | Status: DC

## 2024-03-11 MED ORDER — SERTRALINE HCL 25 MG PO TABS: 25.0000 mg | ORAL_TABLET | Freq: Every day | ORAL | 0 refills | Status: AC

## 2024-03-11 NOTE — Progress Notes (Signed)
 Patient request that Rxs be sent to [facility] instead of [facility] - resent medications and canceled with [facility]. Sent patient MyChart msg to notify (per request)

## 2024-03-15 ENCOUNTER — Other Ambulatory Visit: Payer: Self-pay

## 2024-03-15 DIAGNOSIS — E781 Pure hyperglyceridemia: Secondary | ICD-10-CM

## 2024-03-15 MED ORDER — FENOFIBRATE 145 MG PO TABS: 145.0000 mg | ORAL_TABLET | Freq: Every day | ORAL | 1 refills | Status: AC

## 2024-03-15 MED ORDER — OMEGA-3-ACID ETHYL ESTERS 1 G PO CAPS: 1.0000 g | ORAL_CAPSULE | Freq: Two times a day (BID) | ORAL | 1 refills | Status: AC

## 2024-06-16 ENCOUNTER — Ambulatory Visit: Admitting: Family Medicine
